# Patient Record
Sex: Female | Born: 1985
Health system: Southern US, Community
[De-identification: ages and names within clinical notes are randomized; demographics above are authoritative.]

## PROBLEM LIST (undated history)

## (undated) DIAGNOSIS — M549 Dorsalgia, unspecified: Secondary | ICD-10-CM

## (undated) DIAGNOSIS — R002 Palpitations: Secondary | ICD-10-CM

## (undated) DIAGNOSIS — K5900 Constipation, unspecified: Secondary | ICD-10-CM

## (undated) DIAGNOSIS — R12 Heartburn: Secondary | ICD-10-CM

## (undated) DIAGNOSIS — M2550 Pain in unspecified joint: Secondary | ICD-10-CM

## (undated) DIAGNOSIS — E559 Vitamin D deficiency, unspecified: Secondary | ICD-10-CM

## (undated) DIAGNOSIS — E039 Hypothyroidism, unspecified: Secondary | ICD-10-CM

## (undated) DIAGNOSIS — E538 Deficiency of other specified B group vitamins: Secondary | ICD-10-CM

## (undated) HISTORY — DX: Palpitations: R00.2

## (undated) HISTORY — DX: Vitamin D deficiency, unspecified: E55.9

## (undated) HISTORY — DX: Pain in unspecified joint: M25.50

## (undated) HISTORY — DX: Hypothyroidism, unspecified: E03.9

## (undated) HISTORY — DX: Constipation, unspecified: K59.00

## (undated) HISTORY — DX: Heartburn: R12

## (undated) HISTORY — DX: Deficiency of other specified B group vitamins: E53.8

## (undated) HISTORY — DX: Dorsalgia, unspecified: M54.9

---
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
## 2006-08-06 ENCOUNTER — Encounter: Admission: RE | Admit: 2006-08-06 | Discharge: 2006-08-06 | Payer: Self-pay | Admitting: Family Medicine

## 2007-07-02 ENCOUNTER — Encounter: Admission: RE | Admit: 2007-07-02 | Discharge: 2007-07-02 | Payer: Self-pay | Admitting: Family Medicine

## 2007-07-16 IMAGING — US US SOFT TISSUE HEAD/NECK
1 series · 14 of 25 positions shown · non-contrast
Comparison: None.

CLINICAL DATA: Enlarged thyroid.  Patient hyperthyroid on thyroid medications.
 THYROID ULTRASOUND:
TECHNIQUE: Ultrasound examination of the thyroid gland and adjacent soft tissue structures was performed.

[Series 1: unknown · 0.07mm/px · 14 of 47 slices shown]
[im 1/47]
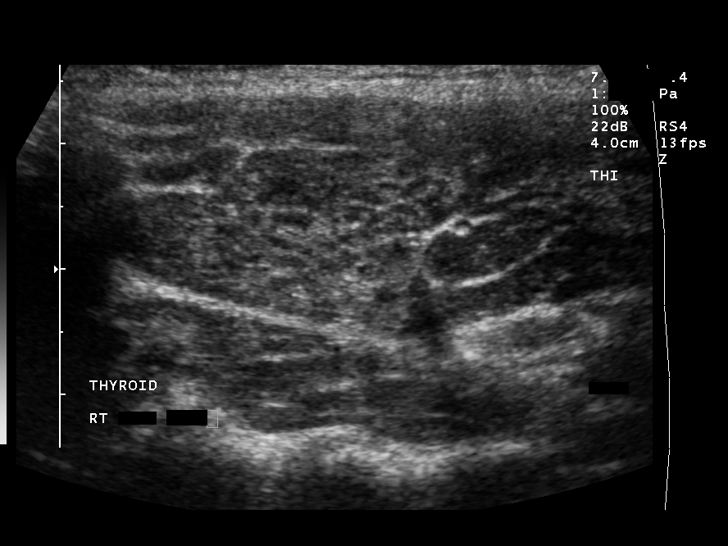
[im 4/47]
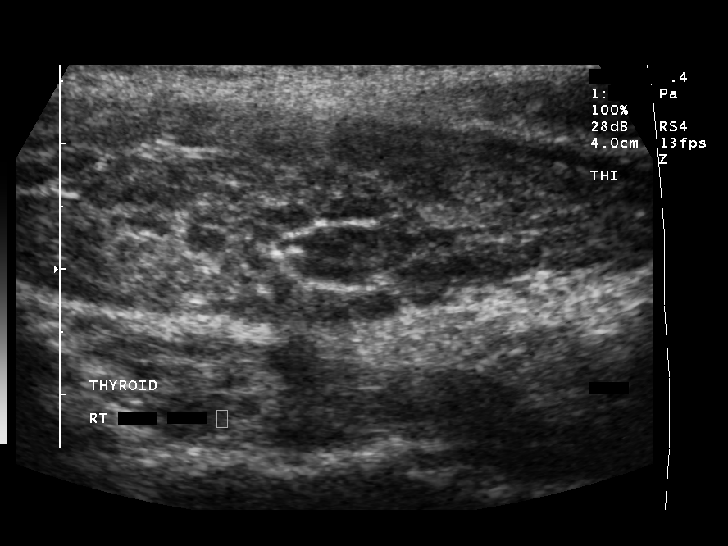
[im 8/47]
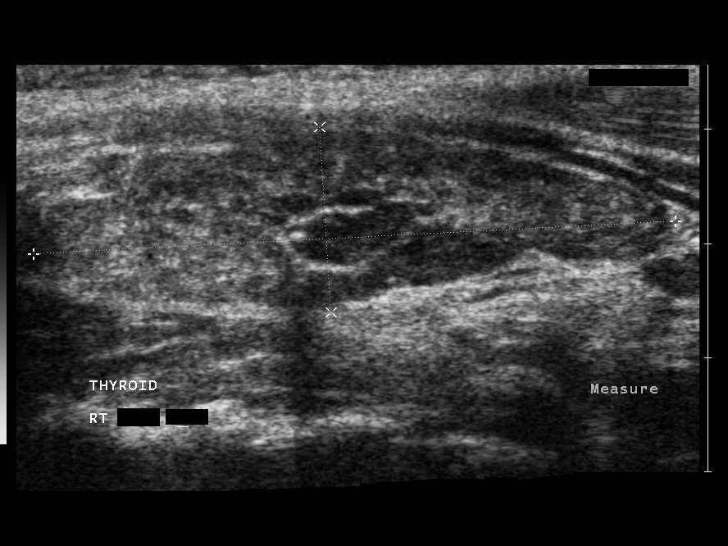
[im 12/47]
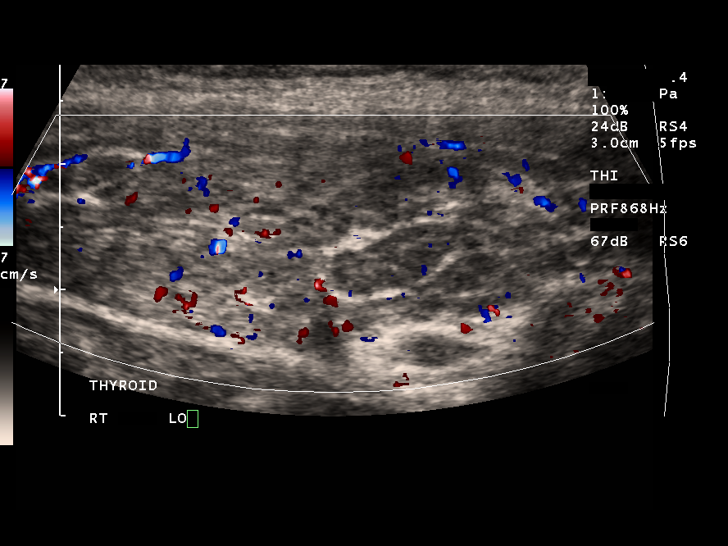
[im 16/47]
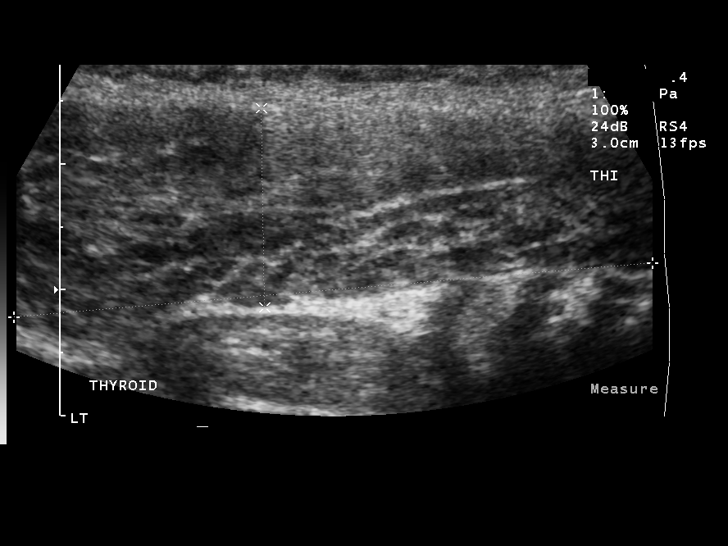
[im 18/47]
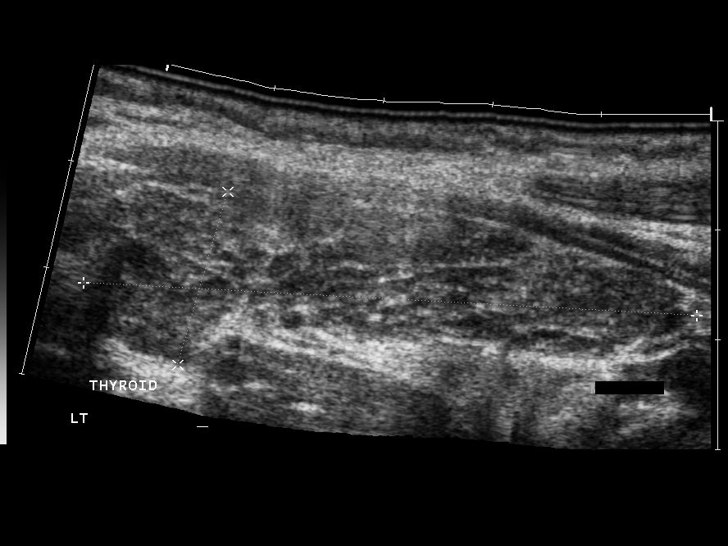
[im 22/47]
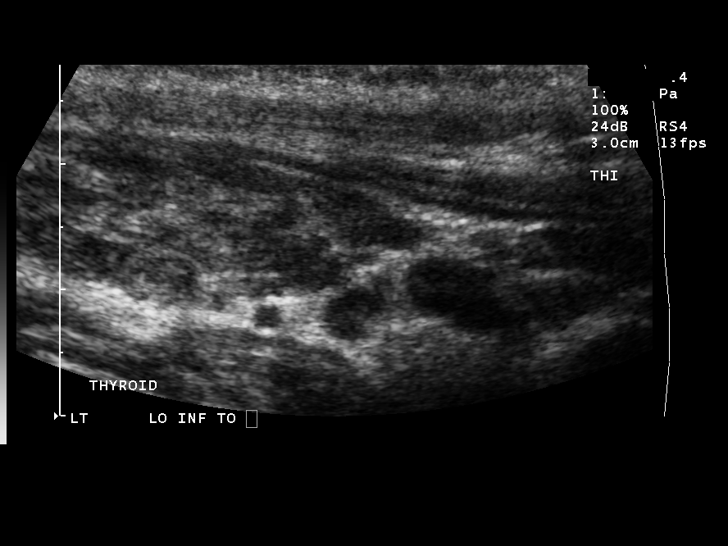
[im 25/47]
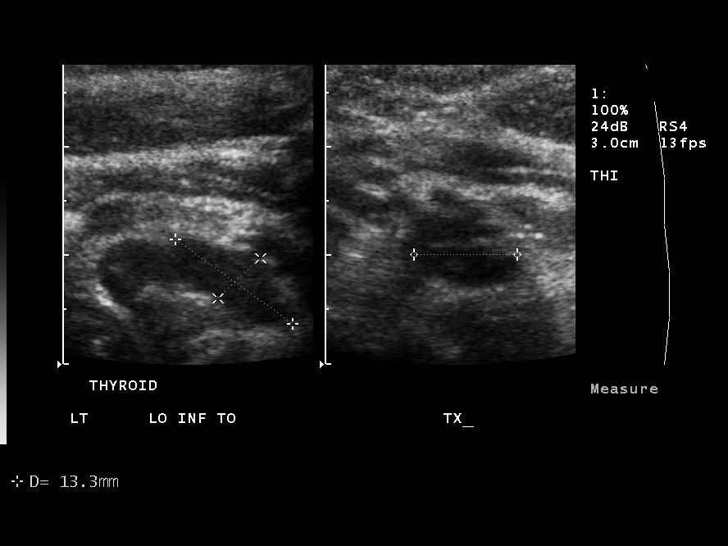
[im 29/47]
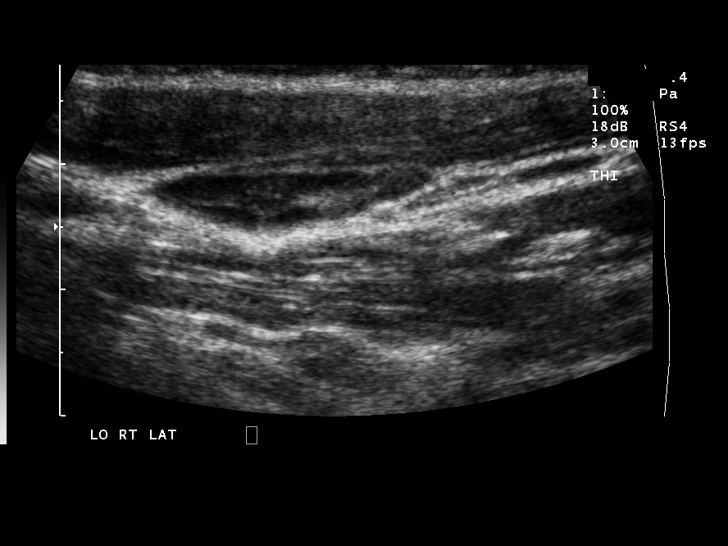
[im 31/47]
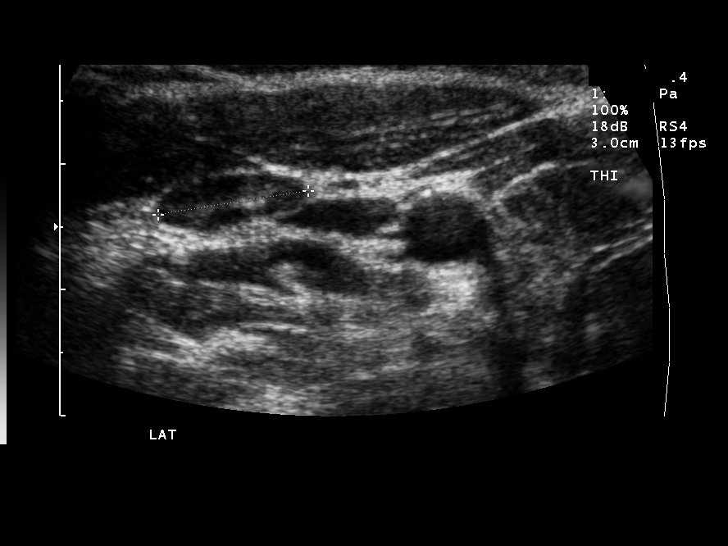
[im 35/47]
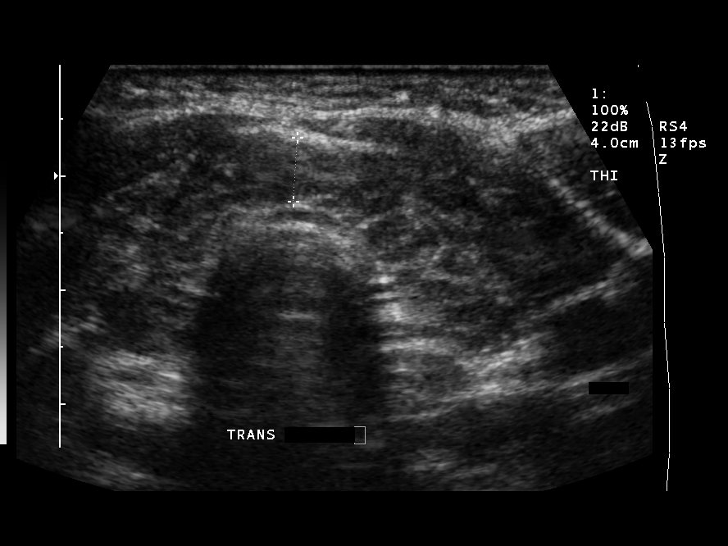
[im 39/47]
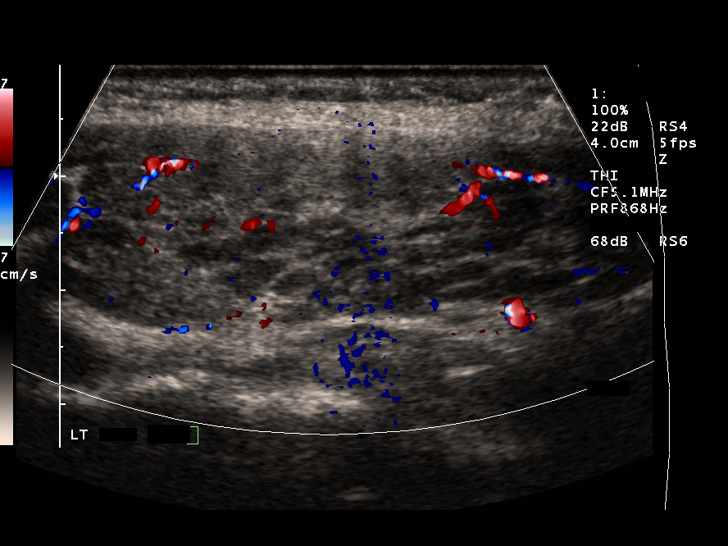
[im 43/47]
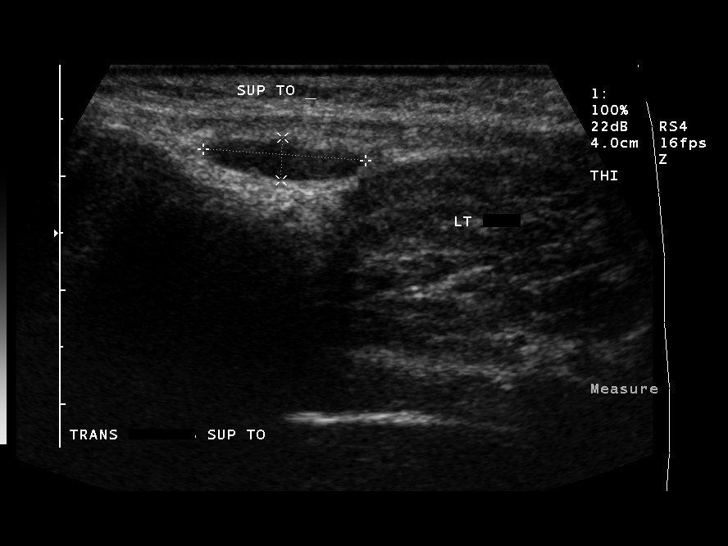
[im 47/47]
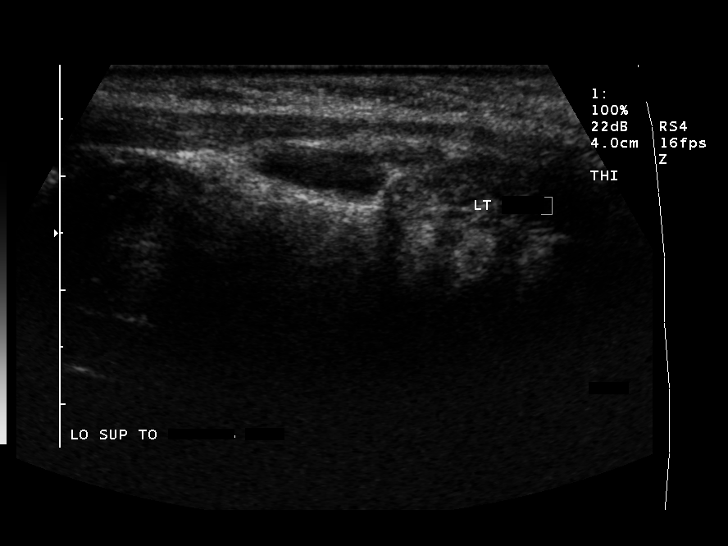

[14 of 25 positions shown; findings below may reference images not displayed]

FINDINGS: The right lobe measures 5.6 x 1.6 x 1.6 cm.  The left lobe measures 5.7 x 1.6 x 1.9 cm.  The isthmus is 6 mm.  Thyroid echotexture is markedly heterogeneous.  Vascularity is increased to the thyroid.  No focal measurable nodules are identified. 
 Incidental note is made of prominent lymph nodes surrounding the thyroid bilaterally.  The largest on the right measures 2.2 cm maximally.
IMPRESSION: 1.  Consistent with the history of hyperthyroidism, the thyroid is enlarged, heterogeneous, and hypervascular.
 2.  No focal measurable nodule is seen.
 3.  Prominent surrounding cervical lymph nodes are most likely reactive.

## 2008-06-10 IMAGING — US US SOFT TISSUE HEAD/NECK
1 series · 14 of 24 positions shown · non-contrast
Comparison: Prior ultrasound of 08/06/06.

CLINICAL DATA: Follow up thyroid goiter.  
 ULTRASOUND SOFT TISSUE HEAD/NECK:
TECHNIQUE: Ultrasound examination of the soft tissues was performed in the area of clinical concern.

[Series 1: unknown · 0.07mm/px · 14 of 24 slices shown]
[im 1/24]
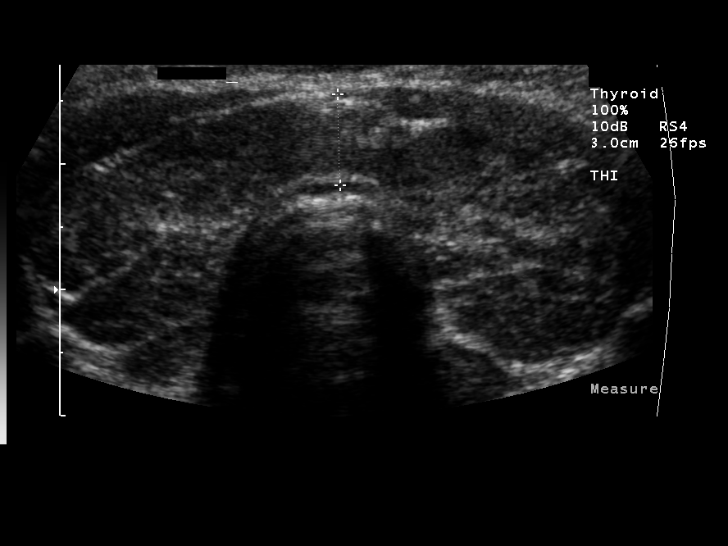
[im 3/24]
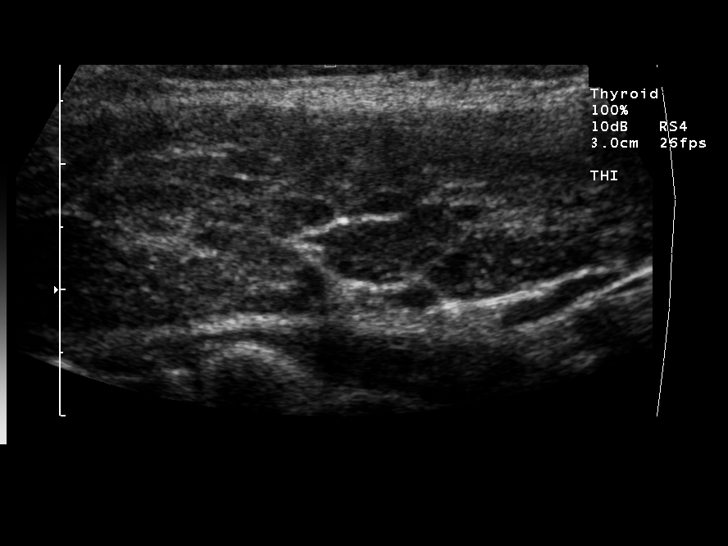
[im 5/24]
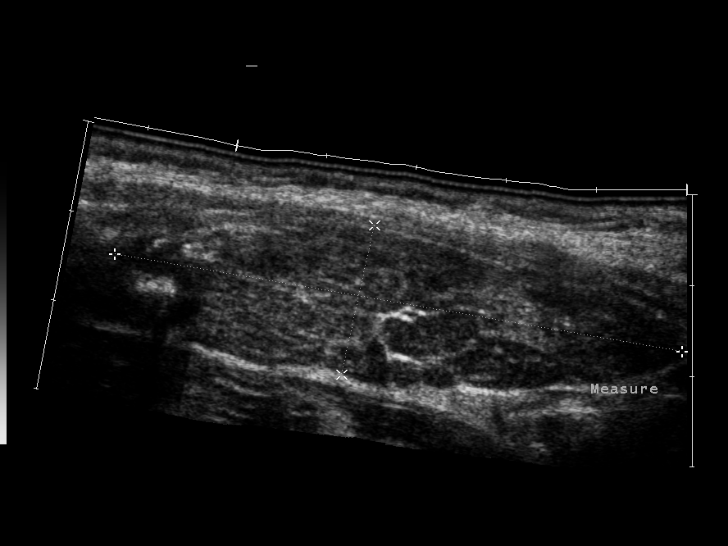
[im 7/24]
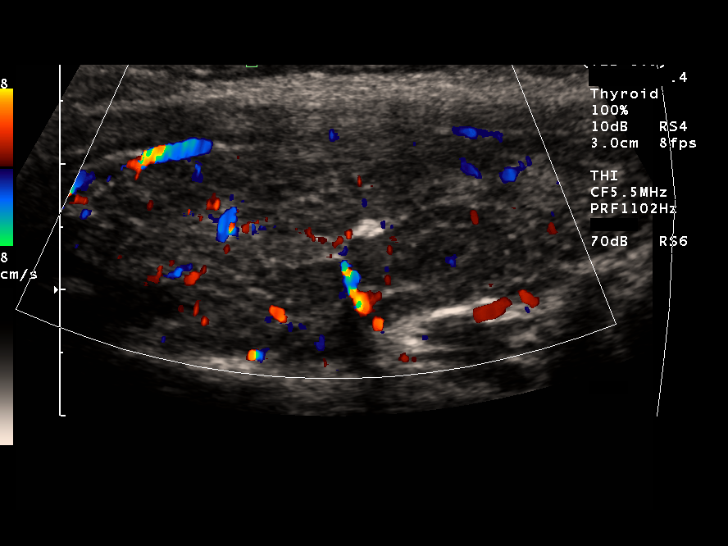
[im 8/24]
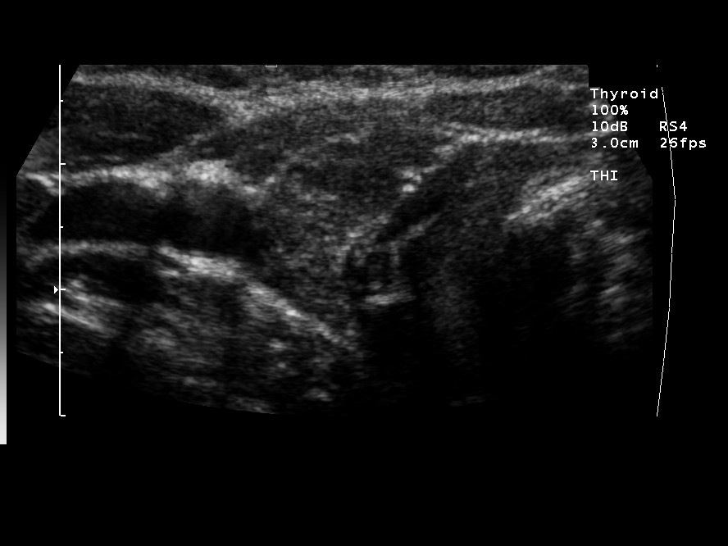
[im 10/24]
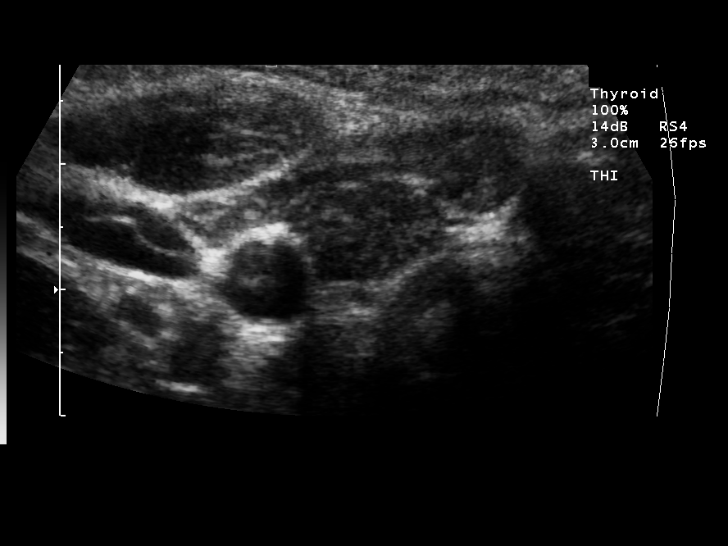
[im 12/24]
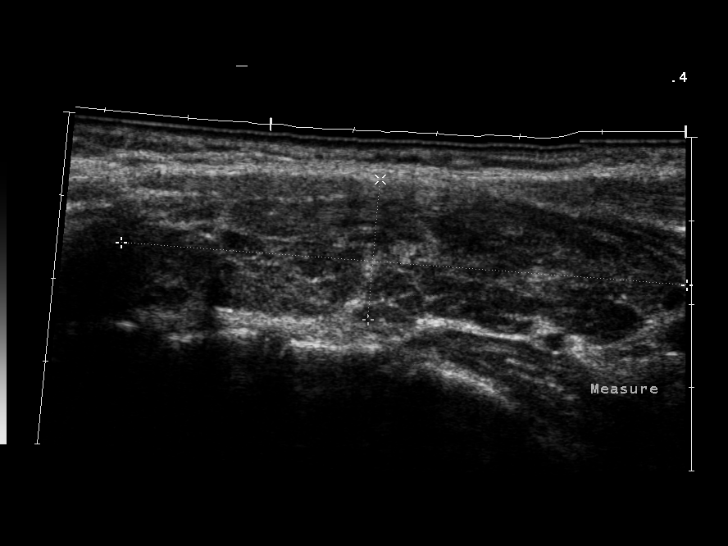
[im 13/24]
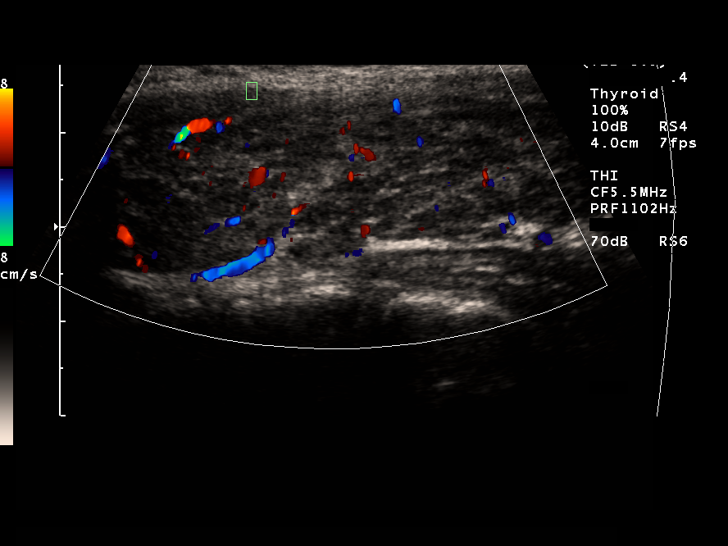
[im 15/24]
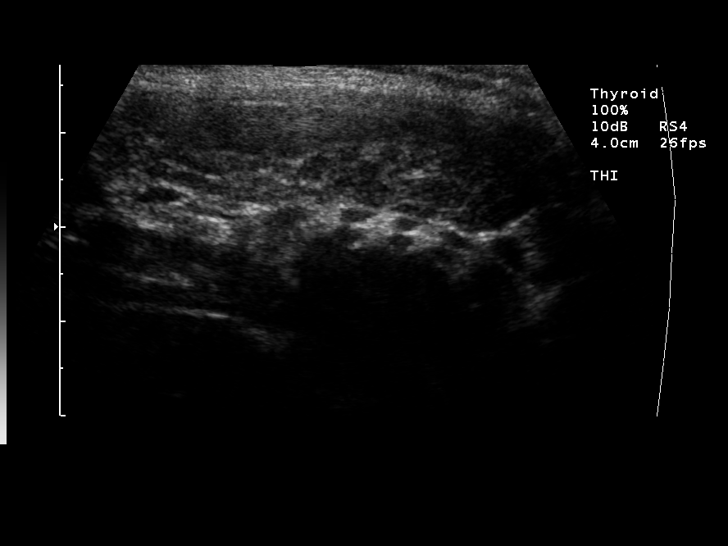
[im 17/24]
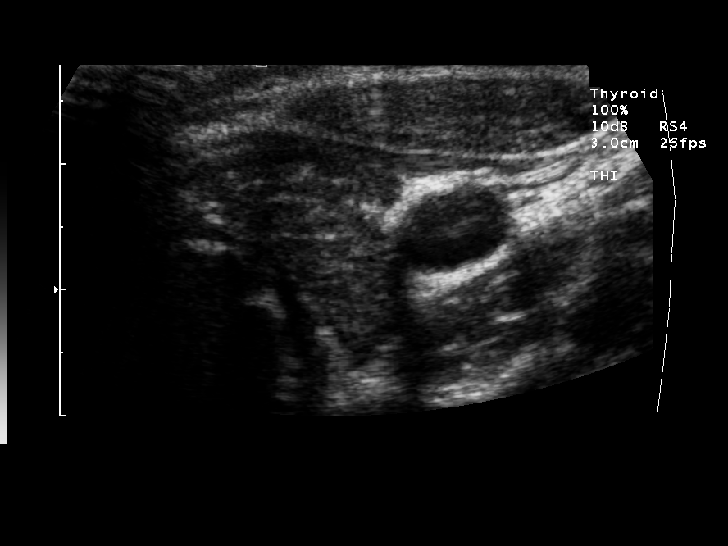
[im 19/24]
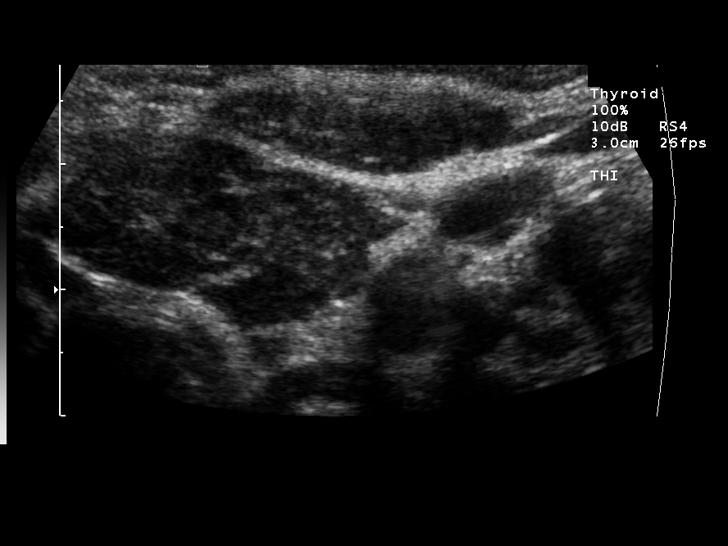
[im 20/24]
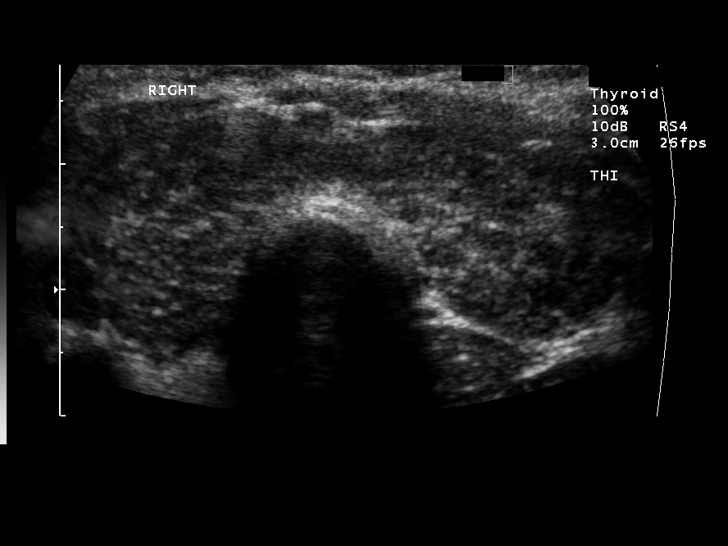
[im 22/24]
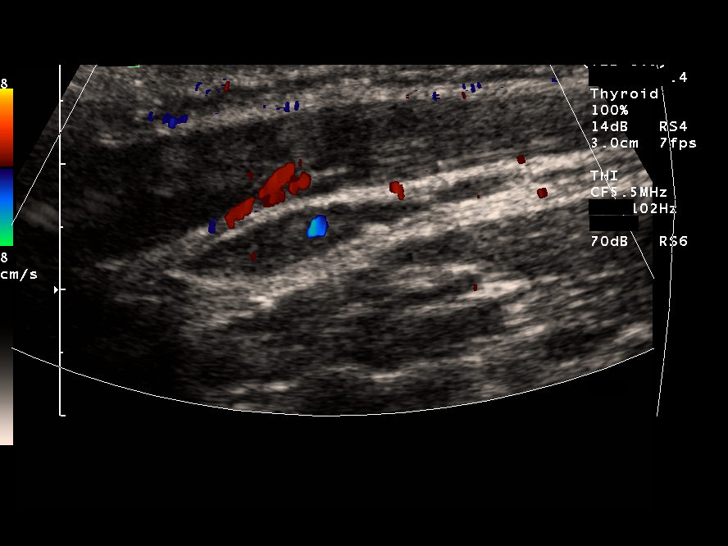
[im 24/24]
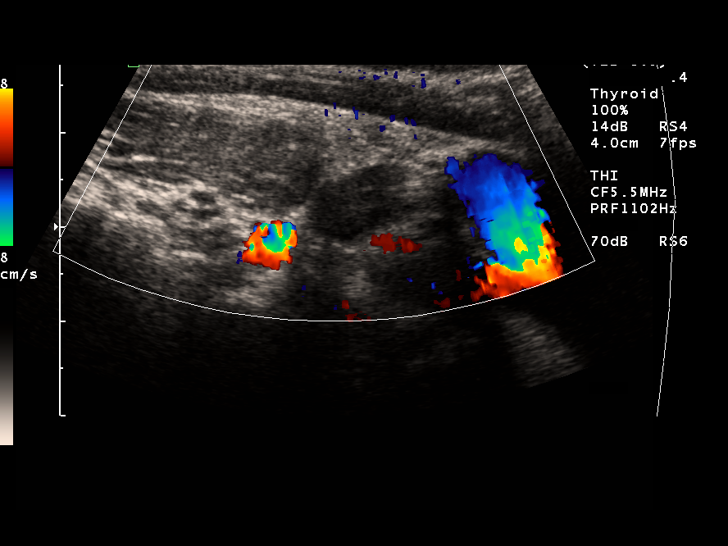

[14 of 24 positions shown; findings below may reference images not displayed]

FINDINGS: The thyroid gland has not changed significantly in size although it appears slightly longer than on the prior study.  The right lobe measures 6.4cm sagittally with a depth of 1.7cm and width of 1.5cm.  The left lobe measures 6.8 x 1.7 x 2.0cm with the isthmus measuring 7mm.  The gland is diffusely inhomogeneous.  No solid or cystic nodule is seen.  Adjacent lymph nodes are again noted not changed significantly.
IMPRESSION: No significant change in inhomogeneous thyroid gland with no discrete solid or cystic nodule.

## 2010-06-15 ENCOUNTER — Other Ambulatory Visit: Admission: RE | Admit: 2010-06-15 | Discharge: 2010-06-15 | Payer: Self-pay | Admitting: *Deleted

## 2015-09-27 MED FILL — LEVOTHYROXINE 125 MCG TAB: 125 | 90 days supply | Qty: 90 | Fill #3

## 2015-09-27 MED FILL — TRI-PREVIFEM TABLET: 0.18/0.215/ | 84 days supply | Qty: 84 | Fill #2

## 2015-12-25 DIAGNOSIS — E039 Hypothyroidism, unspecified: Secondary | ICD-10-CM | POA: Insufficient documentation

## 2015-12-25 DIAGNOSIS — Z30011 Encounter for initial prescription of contraceptive pills: Secondary | ICD-10-CM | POA: Insufficient documentation

## 2015-12-28 DIAGNOSIS — E034 Atrophy of thyroid (acquired): Secondary | ICD-10-CM | POA: Diagnosis not present

## 2015-12-28 DIAGNOSIS — Z0000 Encounter for general adult medical examination without abnormal findings: Secondary | ICD-10-CM | POA: Diagnosis not present

## 2016-01-03 MED FILL — LEVOTHYROXINE 125 MCG TAB: 125 | 90 days supply | Qty: 90 | Fill #0

## 2016-01-03 MED FILL — TRI-PREVIFEM TABLET: 0.18/0.215/ | 84 days supply | Qty: 84 | Fill #3

## 2016-04-11 MED FILL — LEVOTHYROXINE 125 MCG TAB: 125 | 90 days supply | Qty: 90 | Fill #1

## 2016-04-17 MED FILL — TRI-PREVIFEM TABLET: 0.18/0.215/ | 84 days supply | Qty: 84 | Fill #0

## 2016-08-27 MED FILL — TRI-PREVIFEM TABLET: 0.18/0.215/ | 84 days supply | Qty: 84 | Fill #1

## 2016-08-27 MED FILL — LEVOTHYROXINE 125 MCG TABLE: 125 | 90 days supply | Qty: 90 | Fill #2

## 2016-12-31 MED FILL — TRI-PREVIFEM TABLET: 0.18/0.215/ | 84 days supply | Qty: 84 | Fill #2

## 2016-12-31 MED FILL — LEVOTHYROXINE 125 MCG TABLE: 125 | 90 days supply | Qty: 90 | Fill #3

## 2017-01-01 DIAGNOSIS — R87610 Atypical squamous cells of undetermined significance on cytologic smear of cervix (ASC-US): Secondary | ICD-10-CM | POA: Diagnosis not present

## 2017-01-01 DIAGNOSIS — Z01419 Encounter for gynecological examination (general) (routine) without abnormal findings: Secondary | ICD-10-CM | POA: Diagnosis not present

## 2017-01-01 DIAGNOSIS — E039 Hypothyroidism, unspecified: Secondary | ICD-10-CM | POA: Diagnosis not present

## 2017-01-01 DIAGNOSIS — Z0000 Encounter for general adult medical examination without abnormal findings: Secondary | ICD-10-CM | POA: Diagnosis not present

## 2017-01-03 MED FILL — LEVOTHYROXINE 137 MCG TAB: 137 | 30 days supply | Qty: 30 | Fill #0

## 2017-02-04 MED FILL — LEVOTHYROXINE 137 MCG TAB: 137 | 30 days supply | Qty: 30 | Fill #1

## 2017-02-05 MED FILL — VIT D3-50 50,000 UNITS CAPS: 1.25 MG | 84 days supply | Qty: 12 | Fill #0

## 2017-02-11 DIAGNOSIS — E039 Hypothyroidism, unspecified: Secondary | ICD-10-CM | POA: Diagnosis not present

## 2017-05-15 MED FILL — VIT D3-50 50,000 UNITS CAPS: 1.25 MG | 84 days supply | Qty: 12 | Fill #1

## 2017-05-16 MED FILL — LEVOTHYROXINE 125 MCG TABLE: 125 | 30 days supply | Qty: 30 | Fill #0

## 2017-05-16 MED FILL — TRI-PREVIFEM TABLET: 0.18/0.215/ | 84 days supply | Qty: 84 | Fill #0

## 2017-06-23 MED FILL — LEVOTHYROXINE 125 MCG TABLE: 125 | 30 days supply | Qty: 30 | Fill #1

## 2017-08-05 MED FILL — LEVOTHYROXINE 125 MCG TABLE: 125 | 30 days supply | Qty: 30 | Fill #0

## 2017-09-19 MED FILL — LEVOTHYROXINE 125 MCG TAB: 125 | 30 days supply | Qty: 30 | Fill #1

## 2017-10-21 MED FILL — LEVOTHYROXINE 125 MCG TAB: 125 | 30 days supply | Qty: 30 | Fill #0

## 2017-10-21 MED FILL — VIT D3-50 50,000 UNITS CAPS: 1.25 MG | 84 days supply | Qty: 12 | Fill #0

## 2017-10-21 MED FILL — NORG-EE 0.18-0.215-0.25/0.0: 0.18/0.215/ | 28 days supply | Qty: 28 | Fill #0

## 2017-12-17 DIAGNOSIS — E039 Hypothyroidism, unspecified: Secondary | ICD-10-CM | POA: Diagnosis not present

## 2017-12-17 MED FILL — LEVOTHYROXINE 125 MCG TAB: 125 | 15 days supply | Qty: 15 | Fill #0

## 2018-01-02 DIAGNOSIS — Z0000 Encounter for general adult medical examination without abnormal findings: Secondary | ICD-10-CM | POA: Diagnosis not present

## 2018-01-07 DIAGNOSIS — Z800 Family history of malignant neoplasm of digestive organs: Secondary | ICD-10-CM | POA: Insufficient documentation

## 2018-01-07 DIAGNOSIS — Z0000 Encounter for general adult medical examination without abnormal findings: Secondary | ICD-10-CM | POA: Diagnosis not present

## 2018-01-27 MED FILL — LEVOTHYROXINE 125 MCG TAB: 125 | 90 days supply | Qty: 90 | Fill #0

## 2018-05-20 MED FILL — LEVOTHYROXINE 125 MCG TAB: 125 | 90 days supply | Qty: 90 | Fill #1

## 2018-05-20 MED FILL — TRI FEMYNOR 28 TABLET: 0.18/0.215/ | 84 days supply | Qty: 84 | Fill #0

## 2018-08-25 MED FILL — LEVOTHYROXINE 125 MCG TAB: 125 | 90 days supply | Qty: 90 | Fill #2

## 2018-11-12 ENCOUNTER — Ambulatory Visit (INDEPENDENT_AMBULATORY_CARE_PROVIDER_SITE_OTHER): Payer: Self-pay | Admitting: Nurse Practitioner

## 2018-11-12 VITALS — BP 120/80 | HR 82 | Temp 98.6°F | Resp 16 | Wt 235.0 lb

## 2018-11-12 DIAGNOSIS — R05 Cough: Secondary | ICD-10-CM

## 2018-11-12 DIAGNOSIS — J101 Influenza due to other identified influenza virus with other respiratory manifestations: Secondary | ICD-10-CM

## 2018-11-12 DIAGNOSIS — R059 Cough, unspecified: Secondary | ICD-10-CM

## 2018-11-12 LAB — POCT INFLUENZA A/B
INFLUENZA A, POC: POSITIVE — AB
INFLUENZA B, POC: NEGATIVE

## 2018-11-12 MED ORDER — BALOXAVIR MARBOXIL(80 MG DOSE) 2 X 40 MG PO TBPK: 80.0000 mg | ORAL_TABLET | Freq: Once | ORAL | 0 refills | Status: AC

## 2018-11-12 MED ORDER — PROMETHAZINE-DM 6.25-15 MG/5ML PO SYRP: 5.0000 mL | ORAL_SOLUTION | Freq: Four times a day (QID) | ORAL | 0 refills | Status: DC | PRN

## 2018-11-12 MED ORDER — PROMETHAZINE-DM 6.25-15 MG/5ML PO SYRP: 5.0000 mL | ORAL_SOLUTION | Freq: Four times a day (QID) | ORAL | 0 refills | Status: AC | PRN

## 2018-11-12 MED ORDER — FLUTICASONE PROPIONATE 50 MCG/ACT NA SUSP: 2.0000 | Freq: Every day | NASAL | 0 refills | Status: DC

## 2018-11-12 NOTE — Progress Notes (Signed)
Subjective:     Rebecca Mcneil is a 33 y.o. female who presents for evaluation of influenza like symptoms. Symptoms include chills, dry cough, left ear pressure, headache, nasal blockage and sore throat and have been present for 2 days. Patient denies known fever, productive cough, wheezing, SOB, difficulty breathing,  She has tried to alleviate the symptoms with acetaminophen with minimal relief. High risk factors for influenza complications: none.  Patient denies chance of pregnancy. Patient has taken Tylenol for her symptoms with minimal relief.  The following portions of the patient's history were reviewed and updated as appropriate: allergies, current medications and past medical history.  Review of Systems Constitutional: positive for chills and fatigue, negative for anorexia, malaise and sweats Eyes: negative Ears, nose, mouth, throat, and face: positive for sore throat and left ear pressure, left maxillary sinus pressure, negative for ear drainage, earaches and hoarseness Respiratory: positive for cough, negative for asthma, dyspnea on exertion, pleurisy/chest pain, sputum, stridor and wheezing Cardiovascular: negative Gastrointestinal: negative Neurological: positive for headaches, negative for coordination problems, dizziness, gait problems, paresthesia, vertigo and weakness     Objective:    BP 120/80 (BP Location: Right Arm, Patient Position: Sitting, Cuff Size: Normal)   Pulse 82   Temp 98.6 F (37 C)   Resp 16   Wt 235 lb (106.6 kg)   SpO2 98%    Physical Exam Vitals signs reviewed.  Constitutional:      General: She is in acute distress.  HENT:     Head: Normocephalic.     Right Ear: Tympanic membrane, ear canal and external ear normal.     Left Ear: Tympanic membrane, ear canal and external ear normal.     Nose: Mucosal edema and congestion (moderate) present. No rhinorrhea.     Right Turbinates: Enlarged and swollen.     Left Turbinates: Enlarged and swollen.   Right Sinus: No maxillary sinus tenderness or frontal sinus tenderness.     Left Sinus: Maxillary sinus tenderness present. No frontal sinus tenderness.     Mouth/Throat:     Lips: Pink.     Mouth: Mucous membranes are moist.     Pharynx: Uvula midline. Pharyngeal swelling and posterior oropharyngeal erythema present.     Tonsils: No tonsillar exudate or tonsillar abscesses. Swelling: 1+ on the right. 1+ on the left.  Eyes:     Pupils: Pupils are equal, round, and reactive to light.  Neck:     Musculoskeletal: Normal range of motion and neck supple.  Cardiovascular:     Rate and Rhythm: Normal rate and regular rhythm.     Pulses: Normal pulses.     Heart sounds: Normal heart sounds.  Pulmonary:     Effort: Pulmonary effort is normal. No respiratory distress.     Breath sounds: Normal breath sounds. No wheezing, rhonchi or rales.  Abdominal:     General: Abdomen is flat.     Palpations: Abdomen is soft.     Tenderness: There is no abdominal tenderness.  Lymphadenopathy:     Cervical: No cervical adenopathy.  Skin:    General: Skin is warm and dry.     Capillary Refill: Capillary refill takes less than 2 seconds.  Neurological:     General: No focal deficit present.     Mental Status: She is alert and oriented to person, place, and time.     Cranial Nerves: No cranial nerve deficit.  Psychiatric:        Mood and Affect: Mood  normal.        Thought Content: Thought content normal.    Results for orders placed or performed in visit on 11/12/18 (from the past 24 hour(s))  POCT Influenza A/B     Status: Abnormal   Collection Time: 11/12/18  6:20 PM  Result Value Ref Range   Influenza A, POC Positive (A) Negative   Influenza B, POC Negative Negative     Assessment:    Influenza A    Plan:   Exam findings, diagnosis etiology and medication use and indications reviewed with patient. Follow- Up and discharge instructions provided. No emergent/urgent issues found on exam.  I  decided to perform an influenza test on the patient as her symptoms were not classic of influenza as patient's main complaint was her cough.  Patient's influenza results were positive for influenza A.  Patient opted to begin [place], and I will also advised symptomatic treatment for the patient to include fluticasone and Promethazine DM.  Patient was also instructed to perform symptomatic treatment to include antipyretics, increasing fluids, and getting plenty of rest.  The patient is not in any acute distress, is well-appearing, and vital signs are stable at discharge.  Patient education was provided. Patient verbalized understanding of information provided and agrees with plan of care (POC), all questions answered. The patient is advised to call or return to clinic if condition does not see an improvement in symptoms, or to seek the care of the closest emergency department if condition worsens with the above plan.   1. Cough  - POCT Influenza A/B  2. Influenza A  - Baloxavir Marboxil,80 MG Dose, (XOFLUZA) 2 x 40 MG TBPK; Take 80 mg by mouth once for 1 dose.  Dispense: 1 each; Refill: 0 - fluticasone (FLONASE) 50 MCG/ACT nasal spray; Place 2 sprays into both nostrils daily for 10 days.  Dispense: 16 g; Refill: 0 - promethazine-dextromethorphan (PROMETHAZINE-DM) 6.25-15 MG/5ML syrup; Take 5 mLs by mouth 4 (four) times daily as needed for up to 7 days.  Dispense: 140 mL; Refill: 0 -Take medication as prescribed. -Ibuprofen or Tylenol for pain, fever, or general discomfort. -Increase fluids. -Get plenty of rest. -May also use normal saline nasal spray as needed.  -Warm saltwater gargles 3 to 4 times daily for throat pain until symptoms improve. -Sleep elevated on at least 2 pillows at bedtime to help with cough. -Use a humidifier or vaporizer when at home and during sleep to help with cough. -May use a teaspoon of honey or over-the-counter cough drops to help with cough and sore throat. -Follow-up  if symptoms do not improve. Marland Kitchen

## 2018-11-12 NOTE — Patient Instructions (Signed)
Influenza, Adult -Take medication as prescribed. -Ibuprofen or Tylenol for pain, fever, or general discomfort. -Increase fluids. -Get plenty of rest. -May also use normal saline nasal spray as needed.  -Warm saltwater gargles 3 to 4 times daily for throat pain until symptoms improve. -Sleep elevated on at least 2 pillows at bedtime to help with cough. -Use a humidifier or vaporizer when at home and during sleep to help with cough. -May use a teaspoon of honey or over-the-counter cough drops to help with cough and sore throat. -Follow-up if symptoms do not improve.  Influenza, more commonly known as "the flu," is a viral infection that mainly affects the respiratory tract. The respiratory tract includes organs that help you breathe, such as the lungs, nose, and throat. The flu causes many symptoms similar to the common cold along with high fever and body aches. The flu spreads easily from person to person (is contagious). Getting a flu shot (influenza vaccination) every year is the best way to prevent the flu. What are the causes? This condition is caused by the influenza virus. You can get the virus by:  Breathing in droplets that are in the air from an infected person's cough or sneeze.  Touching something that has been exposed to the virus (has been contaminated) and then touching your mouth, nose, or eyes. What increases the risk? The following factors may make you more likely to get the flu:  Not washing or sanitizing your hands often.  Having close contact with many people during cold and flu season.  Touching your mouth, eyes, or nose without first washing or sanitizing your hands.  Not getting a yearly (annual) flu shot. You may have a higher risk for the flu, including serious problems such as a lung infection (pneumonia), if you:  Are older than 65.  Are pregnant.  Have a weakened disease-fighting system (immune system). You may have a weakened immune system if  you: ? Have HIV or AIDS. ? Are undergoing chemotherapy. ? Are taking medicines that reduce (suppress) the activity of your immune system.  Have a long-term (chronic) illness, such as heart disease, kidney disease, diabetes, or lung disease.  Have a liver disorder.  Are severely overweight (morbidly obese).  Have anemia. This is a condition that affects your red blood cells.  Have asthma. What are the signs or symptoms? Symptoms of this condition usually begin suddenly and last 4-14 days. They may include:  Fever and chills.  Headaches, body aches, or muscle aches.  Sore throat.  Cough.  Runny or stuffy (congested) nose.  Chest discomfort.  Poor appetite.  Weakness or fatigue.  Dizziness.  Nausea or vomiting. How is this diagnosed? This condition may be diagnosed based on:  Your symptoms and medical history.  A physical exam.  Swabbing your nose or throat and testing the fluid for the influenza virus. How is this treated? If the flu is diagnosed early, you can be treated with medicine that can help reduce how severe the illness is and how long it lasts (antiviral medicine). This may be given by mouth (orally) or through an IV. Taking care of yourself at home can help relieve symptoms. Your health care provider may recommend:  Taking over-the-counter medicines.  Drinking plenty of fluids. In many cases, the flu goes away on its own. If you have severe symptoms or complications, you may be treated in a hospital. Follow these instructions at home: Activity  Rest as needed and get plenty of sleep.  Stay home  from work or school as told by your health care provider. Unless you are visiting your health care provider, avoid leaving home until your fever has been gone for 24 hours without taking medicine. Eating and drinking  Take an oral rehydration solution (ORS). This is a drink that is sold at pharmacies and retail stores.  Drink enough fluid to keep your  urine pale yellow.  Drink clear fluids in small amounts as you are able. Clear fluids include water, ice chips, diluted fruit juice, and low-calorie sports drinks.  Eat bland, easy-to-digest foods in small amounts as you are able. These foods include bananas, applesauce, rice, lean meats, toast, and crackers.  Avoid drinking fluids that contain a lot of sugar or caffeine, such as energy drinks, regular sports drinks, and soda.  Avoid alcohol.  Avoid spicy or fatty foods. General instructions      Take over-the-counter and prescription medicines only as told by your health care provider.  Use a cool mist humidifier to add humidity to the air in your home. This can make it easier to breathe.  Cover your mouth and nose when you cough or sneeze.  Wash your hands with soap and water often, especially after you cough or sneeze. If soap and water are not available, use alcohol-based hand sanitizer.  Keep all follow-up visits as told by your health care provider. This is important. How is this prevented?   Get an annual flu shot. You may get the flu shot in late summer, fall, or winter. Ask your health care provider when you should get your flu shot.  Avoid contact with people who are sick during cold and flu season. This is generally fall and winter. Contact a health care provider if:  You develop new symptoms.  You have: ? Chest pain. ? Diarrhea. ? A fever.  Your cough gets worse.  You produce more mucus.  You feel nauseous or you vomit. Get help right away if:  You develop shortness of breath or difficulty breathing.  Your skin or nails turn a bluish color.  You have severe pain or stiffness in your neck.  You develop a sudden headache or sudden pain in your face or ear.  You cannot eat or drink without vomiting. Summary  Influenza, more commonly known as "the flu," is a viral infection that primarily affects your respiratory tract.  Symptoms of the flu usually  begin suddenly and last 4-14 days.  Getting an annual flu shot is the best way to prevent getting the flu.  Stay home from work or school as told by your health care provider. Unless you are visiting your health care provider, avoid leaving home until your fever has been gone for 24 hours without taking medicine.  Keep all follow-up visits as told by your health care provider. This is important. This information is not intended to replace advice given to you by your health care provider. Make sure you discuss any questions you have with your health care provider. Document Released: 08/30/2000 Document Revised: 02/18/2018 Document Reviewed: 02/18/2018 [employer] Interactive Patient Education  2019 [employer].

## 2018-11-13 MED FILL — PROMETHAZINE W/DM SYRUP: 6.25-15 | 7 days supply | Qty: 140 | Fill #0

## 2018-11-16 ENCOUNTER — Telehealth: Payer: Self-pay

## 2018-11-16 NOTE — Telephone Encounter (Signed)
Patient did not answered the phone

## 2018-12-03 MED FILL — LEVOTHYROXINE 125 MCG TAB: 125 | 90 days supply | Qty: 90 | Fill #3

## 2019-02-11 DIAGNOSIS — Z0000 Encounter for general adult medical examination without abnormal findings: Secondary | ICD-10-CM | POA: Diagnosis not present

## 2019-03-03 DIAGNOSIS — Z124 Encounter for screening for malignant neoplasm of cervix: Secondary | ICD-10-CM | POA: Diagnosis not present

## 2019-03-03 DIAGNOSIS — E559 Vitamin D deficiency, unspecified: Secondary | ICD-10-CM | POA: Diagnosis not present

## 2019-03-03 DIAGNOSIS — Z800 Family history of malignant neoplasm of digestive organs: Secondary | ICD-10-CM | POA: Diagnosis not present

## 2019-03-03 DIAGNOSIS — Z3041 Encounter for surveillance of contraceptive pills: Secondary | ICD-10-CM | POA: Diagnosis not present

## 2019-03-03 DIAGNOSIS — Z0000 Encounter for general adult medical examination without abnormal findings: Secondary | ICD-10-CM | POA: Diagnosis not present

## 2019-03-03 MED FILL — TRI FEMYNOR 28 TABLET: 0.18/0.215/ | 84 days supply | Qty: 84 | Fill #0

## 2019-03-08 MED FILL — LEVOTHYROXINE 125 MCG TAB: 125 | 90 days supply | Qty: 90 | Fill #0

## 2019-04-15 DIAGNOSIS — Z800 Family history of malignant neoplasm of digestive organs: Secondary | ICD-10-CM | POA: Diagnosis not present

## 2019-04-15 DIAGNOSIS — Z1211 Encounter for screening for malignant neoplasm of colon: Secondary | ICD-10-CM | POA: Diagnosis not present

## 2019-07-13 MED FILL — LEVOTHYROXINE 125 MCG TAB: 125 | 90 days supply | Qty: 90 | Fill #1

## 2019-07-28 ENCOUNTER — Ambulatory Visit (INDEPENDENT_AMBULATORY_CARE_PROVIDER_SITE_OTHER): Payer: 59 | Admitting: Family Medicine

## 2019-07-28 ENCOUNTER — Encounter (INDEPENDENT_AMBULATORY_CARE_PROVIDER_SITE_OTHER): Payer: Self-pay | Admitting: Family Medicine

## 2019-07-28 ENCOUNTER — Encounter: Payer: Self-pay | Admitting: Family Medicine

## 2019-07-28 ENCOUNTER — Other Ambulatory Visit: Payer: Self-pay

## 2019-07-28 VITALS — BP 131/82 | HR 81 | Temp 98.1°F | Ht 66.0 in | Wt 244.0 lb

## 2019-07-28 DIAGNOSIS — R5383 Other fatigue: Secondary | ICD-10-CM | POA: Diagnosis not present

## 2019-07-28 DIAGNOSIS — E039 Hypothyroidism, unspecified: Secondary | ICD-10-CM | POA: Diagnosis not present

## 2019-07-28 DIAGNOSIS — R0600 Dyspnea, unspecified: Secondary | ICD-10-CM | POA: Diagnosis not present

## 2019-07-28 DIAGNOSIS — Z0289 Encounter for other administrative examinations: Secondary | ICD-10-CM

## 2019-07-28 DIAGNOSIS — F3289 Other specified depressive episodes: Secondary | ICD-10-CM

## 2019-07-28 DIAGNOSIS — E559 Vitamin D deficiency, unspecified: Secondary | ICD-10-CM

## 2019-07-28 DIAGNOSIS — N926 Irregular menstruation, unspecified: Secondary | ICD-10-CM | POA: Diagnosis not present

## 2019-07-28 DIAGNOSIS — R0609 Other forms of dyspnea: Secondary | ICD-10-CM

## 2019-07-28 DIAGNOSIS — Z6839 Body mass index (BMI) 39.0-39.9, adult: Secondary | ICD-10-CM

## 2019-07-28 DIAGNOSIS — Z9189 Other specified personal risk factors, not elsewhere classified: Secondary | ICD-10-CM

## 2019-07-28 NOTE — Progress Notes (Signed)
.  Office: [phone]  /  Fax: [phone]   HPI:   Chief Complaint: OBESITY  Rebecca Mcneil (MR# [id number]) is a 33 y.o. female who presents on 07/28/2019 for obesity evaluation and treatment. Current BMI is Body mass index is 39.38 kg/m.Marland Kitchen Rebecca Mcneil has struggled with obesity for years and has been unsuccessful in either losing weight or maintaining long term weight loss. Rebecca Mcneil attended our information session and states she is currently in the action stage of change and ready to dedicate time achieving and maintaining a healthier weight.   Rebecca Mcneil states her family eats meals together she thinks her family will eat healthier with her she struggles with family and or coworkers weight loss sabotage her desired weight loss is 74 lbs she has been heavy most of her life she started gaining weight 3 years ago her heaviest weight ever was 244 lbs. she is somewhat of a picky eater and doesn't like to eat healthier foods  she has significant food cravings issues  she snacks frequently in the evenings she skips lunch 2-3 times per week she frequently makes poor food choices she frequently eats larger portions than normal  she has binge eating behaviors she struggles with emotional eating    Fatigue Rebecca Mcneil feels her energy is lower than it should be. This has worsened with weight gain and has worsened recently. Rebecca Mcneil denies daytime somnolence and admits to waking up still tired. Patient generally gets 6-7 hours of sleep per night, and states they generally do not sleep well most nights. Snoring is not present. Apneic episodes are not present. Epworth Sleepiness Score is 3. 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 She reports skipping breakfast and lunch most days.  Dyspnea on exertion Rebecca Mcneil notes increasing shortness of breath with exercising and seems to be worsening over time with weight gain. She notes getting out of breath sooner with activity than she used to.  This has gotten worse recently. Rebecca Mcneil denies orthopnea.  Vitamin D deficiency Rebecca Mcneil has a diagnosis of Vitamin D deficiency. She is currently taking a Vit D supplement and denies nausea, vomiting or muscle weakness.  At risk for osteopenia and osteoporosis Rebecca Mcneil is at higher risk of osteopenia and osteoporosis due to Vitamin D deficiency.   Irregular Menses Rebecca Mcneil has a diagnosis of irregular menses and is taking oral contraceptives to regulate her periods.  Depression with emotional eating behaviors Rebecca Mcneil is struggling with emotional eating and using food for comfort to the extent that it is negatively impacting her health. She often snacks when she is not hungry. Rebecca Mcneil sometimes feels she is out of control and then feels guilty that she made poor food choices. She has been working on behavior modification techniques to help reduce her emotional eating and has been somewhat successful. She shows no sign of suicidal or homicidal ideations.  Depression Screen Rebecca Mcneil's Food and Mood (modified PHQ-9) score was 11. Depression screen PHQ 2/9 07/28/2019  Decreased Interest 2  Down, Depressed, Hopeless 1  PHQ - 2 Score 3  Altered sleeping 1  Tired, decreased energy 3  Change in appetite 2  Feeling bad or failure about yourself  1  Trouble concentrating 1  Moving slowly or fidgety/restless 0  Suicidal thoughts 0  PHQ-9 Score 11  Difficult doing work/chores Somewhat difficult   Hypothyroidism Rebecca Mcneil has a diagnosis of hypothyroidism. She denies hot or cold intolerance or palpitations, but does admit to ongoing fatigue.  ASSESSMENT AND PLAN:  Other fatigue - Plan:  EKG 12-Lead, B12, Insulin, random, HgB A1c, Comprehensive Metabolic Panel (CMET)  Dyspnea on exertion - Plan: Lipid Panel With LDL/HDL Ratio  Acquired hypothyroidism - Plan: T3, T4, free, TSH  Vitamin D deficiency - Plan: Vitamin D (25 hydroxy)  Other depression  Irregular menses - Plan: CBC w/Diff/Platelet,  Comprehensive Metabolic Panel (CMET)  At risk for diabetes mellitus  Class 2 severe obesity with serious comorbidity and body mass index (BMI) of 39.0 to 39.9 in adult, unspecified obesity type (HCC)  PLAN:  Fatigue Rebecca Mcneil was informed that her fatigue may be related to obesity, depression or many other causes. Labs will be ordered, and in the meanwhile Rebecca Mcneil has agreed to work on diet, exercise and weight loss to help with fatigue. Proper sleep hygiene was discussed including the need for 7-8 hours of quality sleep each night. A sleep study was not ordered based on symptoms and Epworth score.  Dyspnea on exertion Rebecca Mcneil's shortness of breath appears to be obesity related and exercise induced. She had an increased BMR per IC, which is great. She has agreed to work on weight loss and gradually increase exercise to treat her exercise induced shortness of breath. If Rebecca Mcneil follows our instructions and loses weight without improvement of her shortness of breath, we will plan to refer to pulmonology. We will monitor this condition regularly. Rebecca Mcneil agrees to this plan.   Vitamin D Deficiency Rebecca Mcneil was informed that low Vitamin D levels contributes to fatigue and are associated with obesity, breast, and colon cancer. She will have routine testing of Vitamin D and follow-up with our clinic in 2 weeks.  At risk for osteopenia and osteoporosis Rebecca Mcneil was given extended  (15 minutes) osteoporosis prevention counseling today. Rebecca Mcneil is at risk for osteopenia and osteoporosis due to her Vitamin D deficiency. She was encouraged to take her Vitamin D and follow her higher calcium diet and increase strengthening exercise to help strengthen her bones and decrease her risk of osteopenia and osteoporosis.  Irregular Menses Rebecca Mcneil will continue her current regimen and follow-up as directed.  Depression with Emotional Eating Behaviors We discussed behavior modification techniques today to help Rebecca Mcneil deal  with her emotional eating and depression. Rebecca Mcneil will be offered and appointment with Dr. Mallie Mussel, our bariatric psychologist, for evaluation.  Depression Screen Rebecca Mcneil had a moderately positive depression screening. Depression is commonly associated with obesity and often results in emotional eating behaviors. We will monitor this closely and work on CBT to help improve the non-hunger eating patterns. Referral to Psychology may be required if no improvement is seen as she continues in our clinic.  Hypothyroidism Rebecca Mcneil was informed of the importance of good thyroid control to help with weight loss efforts. She was also informed that supertheraputic thyroid levels are dangerous and will not improve weight loss results. She will have labs checked and follow-up as directed for review of lab results.  Obesity Rebecca Mcneil is currently in the action stage of change and her goal is to continue with weight loss efforts She has agreed to follow the Category 4 plan. Rebecca Mcneil has been instructed to exercise as tolerated for weight loss and overall health benefits. We discussed the following Behavioral Modification Strategies today: increasing lean protein intake, decreasing simple carbohydrates, increasing vegetables, increase H20 intake, decrease liquid calories, work on meal planning and easy cooking plans, emotional eating strategies, and holiday eating strategies.  Rebecca Mcneil has agreed to follow-up with our clinic in 2 weeks. She was informed of the importance of frequent follow-up visits to  maximize her success with intensive lifestyle modifications for her multiple health conditions. She was informed we would discuss her lab results at her next visit unless there is a critical issue that needs to be addressed sooner. Rebecca Mcneil agreed to keep her next visit at the agreed upon time to discuss these results.  ALLERGIES: No Known Allergies  MEDICATIONS: Current Outpatient Medications on File Prior to Visit    Medication Sig Dispense Refill  . cetirizine (ZYRTEC) 10 MG tablet Take 10 mg by mouth daily.    . Cholecalciferol (VITAMIN D3) 125 MCG (5000 UT) CAPS Take by mouth.    . levothyroxine (SYNTHROID, LEVOTHROID) 125 MCG tablet Take by mouth.    . Multiple Vitamins-Minerals (MULTIVITAMIN ADULT PO) Take by mouth.    . Nutritional Supplements (ADULT NUTRITIONAL SUPPLEMENT PO) Take by mouth. ASEA 4 oz BID    . TRI FEMYNOR 0.18/0.215/0.25 MG-35 MCG tablet      No current facility-administered medications on file prior to visit.     PAST MEDICAL HISTORY: Past Medical History:  Diagnosis Date  . Back pain   . Constipation   . Heartburn   . Hypothyroidism   . Joint pain   . Palpitations   . Vitamin D deficiency     PAST SURGICAL HISTORY: Past Surgical History:  Procedure Laterality Date  . WISDOM TOOTH EXTRACTION  2000    SOCIAL HISTORY: Social History   Tobacco Use  . Smoking status: Never Smoker  . Smokeless tobacco: Never Used  Substance Use Topics  . Alcohol use: Not on file  . Drug use: Not on file    FAMILY HISTORY: Family History  Problem Relation Age of Onset  . Hypertension Mother   . Thyroid disease Mother   . Obesity Father    ROS: Review of Systems  Constitutional: Positive for malaise/fatigue.  Cardiovascular: Negative for palpitations and orthopnea.  Gastrointestinal: Positive for constipation. Negative for nausea and vomiting.  Musculoskeletal:       Negative for muscle weakness.  Endo/Heme/Allergies:       Negative for hot/cold intolerance.  Psychiatric/Behavioral: Positive for depression (emotional eating). Negative for suicidal ideas.       Negative for homicidal ideas.   PHYSICAL EXAM: Blood pressure 131/82, pulse 81, temperature 98.1 F (36.7 C), temperature source Oral, height 5' 6"  (1.676 m), weight 244 lb (110.7 kg), last menstrual period 07/03/2019, SpO2 100 %. Body mass index is 39.38 kg/m. Physical Exam Vitals signs reviewed.   Constitutional:      Appearance: Normal appearance. She is well-developed. She is obese.  HENT:     Head: Normocephalic and atraumatic.     Nose: Nose normal.  Eyes:     General: No scleral icterus. Neck:     Musculoskeletal: Normal range of motion.  Cardiovascular:     Rate and Rhythm: Normal rate and regular rhythm.  Pulmonary:     Effort: Pulmonary effort is normal. No respiratory distress.  Abdominal:     Palpations: Abdomen is soft.     Tenderness: There is no abdominal tenderness.  Musculoskeletal: Normal range of motion.     Comments: Range of motion normal in all four extremities.  Skin:    General: Skin is warm and dry.  Neurological:     Mental Status: She is alert and oriented to person, place, and time.     Coordination: Coordination normal.  Psychiatric:        Mood and Affect: Mood and affect normal.  Behavior: Behavior normal.   RECENT LABS AND TESTS: BMET No results found for: NA, K, CL, CO2, GLUCOSE, BUN, CREATININE, CALCIUM, GFRNONAA, GFRAA No results found for: HGBA1C No results found for: INSULIN CBC No results found for: WBC, RBC, HGB, HCT, PLT, MCV, MCH, MCHC, RDW, LYMPHSABS, MONOABS, EOSABS, BASOSABS Iron/TIBC/Ferritin/ %Sat No results found for: IRON, TIBC, FERRITIN, IRONPCTSAT Lipid Panel  No results found for: CHOL, TRIG, HDL, CHOLHDL, VLDL, LDLCALC, LDLDIRECT Hepatic Function Panel  No results found for: PROT, ALBUMIN, AST, ALT, ALKPHOS, BILITOT, BILIDIR, IBILI No results found for: TSH  No results found for Vitamin D, 25-Hydroxy  ECG  shows Sinus  Rhythm with a rate of 84 BPM. Low voltage in precordial leads. RSR(V1) -nondiagnostic. Nonspecific T-abnormality. Nondiagnostic for age. Abnormal.  INDIRECT CALORIMETER done today shows a VO2 of 369 and a REE of 2566. Her calculated basal metabolic rate is 7341 thus her basal metabolic rate is better than expected.  OBESITY BEHAVIORAL INTERVENTION VISIT  Today's visit was #1  Starting  weight: 244 lbs Starting date: 07/28/2019 Today's weight: 244 lbs  Today's date: 07/28/2019 Total lbs lost to date: 0    07/28/2019  Height 5' 6"  (1.676 m)  Weight 244 lb (110.7 kg)  BMI (Calculated) 39.4  BLOOD PRESSURE - SYSTOLIC 937  BLOOD PRESSURE - DIASTOLIC 82  Waist Measurement  50 inches   Body Fat % 47.9 %  Total Body Water (lbs) 92 lbs  RMR 2566   ASK: We discussed the diagnosis of obesity with Brigid Re today and Kaileigh agreed to give [place] permission to discuss obesity behavioral modification therapy today.  ASSESS: Orlinda has the diagnosis of obesity and her BMI today is 39.4. Jaysha is in the action stage of change.   ADVISE: Kashawn was educated on the multiple health risks of obesity as well as the benefit of weight loss to improve her health. She was advised of the need for long term treatment and the importance of lifestyle modifications to improve her current health and to decrease her risk of future health problems.  AGREE: Multiple dietary modification options and treatment options were discussed and  Keryn agreed to follow the recommendations documented in the above note.  ARRANGE: Adina was educated on the importance of frequent visits to treat obesity as outlined per CMS and USPSTF guidelines and agreed to schedule her next follow up appointment today.  IMichaelene Song, am acting as Location manager for [employer]. Juleen China, DO  I have reviewed the above documentation for accuracy and completeness, and I agree with the above. Briscoe Deutscher, DO

## 2019-07-29 LAB — CBC WITH DIFFERENTIAL/PLATELET
Basophils Absolute: 0 10*3/uL (ref 0.0–0.2)
Basos: 0 %
EOS (ABSOLUTE): 0.1 10*3/uL (ref 0.0–0.4)
Eos: 1 %
Hematocrit: 40.6 % (ref 34.0–46.6)
Hemoglobin: 13.3 g/dL (ref 11.1–15.9)
Immature Grans (Abs): 0 10*3/uL (ref 0.0–0.1)
Immature Granulocytes: 0 %
Lymphocytes Absolute: 3 10*3/uL (ref 0.7–3.1)
Lymphs: 43 %
MCH: 28.3 pg (ref 26.6–33.0)
MCHC: 32.8 g/dL (ref 31.5–35.7)
MCV: 86 fL (ref 79–97)
Monocytes Absolute: 0.5 10*3/uL (ref 0.1–0.9)
Monocytes: 6 %
Neutrophils Absolute: 3.5 10*3/uL (ref 1.4–7.0)
Neutrophils: 50 %
Platelets: 338 10*3/uL (ref 150–450)
RBC: 4.7 x10E6/uL (ref 3.77–5.28)
RDW: 12 % (ref 11.7–15.4)
WBC: 7.2 10*3/uL (ref 3.4–10.8)

## 2019-07-29 LAB — LIPID PANEL WITH LDL/HDL RATIO
Cholesterol, Total: 174 mg/dL (ref 100–199)
HDL: 50 mg/dL (ref >39)
LDL Chol Calc (NIH): 105 mg/dL — ABNORMAL HIGH (ref 0–99)
LDL/HDL Ratio: 2.1 ratio (ref 0.0–3.2)
Triglycerides: 104 mg/dL (ref 0–149)
VLDL Cholesterol Cal: 19 mg/dL (ref 5–40)

## 2019-07-29 LAB — COMPREHENSIVE METABOLIC PANEL
ALT: 21 IU/L (ref 0–32)
AST: 18 IU/L (ref 0–40)
Albumin/Globulin Ratio: 1.7 (ref 1.2–2.2)
Albumin: 4.1 g/dL (ref 3.8–4.8)
Alkaline Phosphatase: 101 IU/L (ref 39–117)
BUN/Creatinine Ratio: 20 (ref 9–23)
BUN: 15 mg/dL (ref 6–20)
Bilirubin Total: 0.3 mg/dL (ref 0.0–1.2)
CO2: 23 mmol/L (ref 20–29)
Calcium: 9.3 mg/dL (ref 8.7–10.2)
Chloride: 107 mmol/L — ABNORMAL HIGH (ref 96–106)
Creatinine, Ser: 0.74 mg/dL (ref 0.57–1.00)
GFR calc Af Amer: 124 mL/min/{1.73_m2} (ref >59)
GFR calc non Af Amer: 108 mL/min/{1.73_m2} (ref >59)
Globulin, Total: 2.4 g/dL (ref 1.5–4.5)
Glucose: 91 mg/dL (ref 65–99)
Potassium: 4.4 mmol/L (ref 3.5–5.2)
Sodium: 142 mmol/L (ref 134–144)
Total Protein: 6.5 g/dL (ref 6.0–8.5)

## 2019-07-29 LAB — INSULIN, RANDOM: INSULIN: 18.4 u[IU]/mL (ref 2.6–24.9)

## 2019-07-29 LAB — T3: T3, Total: 103 ng/dL (ref 71–180)

## 2019-07-29 LAB — HEMOGLOBIN A1C
Est. average glucose Bld gHb Est-mCnc: 97 mg/dL
Hgb A1c MFr Bld: 5 % (ref 4.8–5.6)

## 2019-07-29 LAB — TSH: TSH: 5.65 u[IU]/mL — ABNORMAL HIGH (ref 0.450–4.500)

## 2019-07-29 LAB — T4, FREE: Free T4: 1.08 ng/dL (ref 0.82–1.77)

## 2019-07-29 LAB — VITAMIN B12: Vitamin B-12: 293 pg/mL (ref 232–1245)

## 2019-07-29 LAB — VITAMIN D 25 HYDROXY (VIT D DEFICIENCY, FRACTURES): Vit D, 25-Hydroxy: 33 ng/mL (ref 30.0–100.0)

## 2019-08-10 ENCOUNTER — Other Ambulatory Visit: Payer: Self-pay

## 2019-08-10 ENCOUNTER — Encounter (INDEPENDENT_AMBULATORY_CARE_PROVIDER_SITE_OTHER): Payer: Self-pay | Admitting: Family Medicine

## 2019-08-10 ENCOUNTER — Ambulatory Visit (INDEPENDENT_AMBULATORY_CARE_PROVIDER_SITE_OTHER): Payer: 59 | Admitting: Family Medicine

## 2019-08-10 VITALS — BP 118/80 | HR 82 | Temp 98.3°F | Ht 66.0 in | Wt 236.0 lb

## 2019-08-10 DIAGNOSIS — E559 Vitamin D deficiency, unspecified: Secondary | ICD-10-CM

## 2019-08-10 DIAGNOSIS — E8881 Metabolic syndrome: Secondary | ICD-10-CM

## 2019-08-10 DIAGNOSIS — Z6838 Body mass index (BMI) 38.0-38.9, adult: Secondary | ICD-10-CM

## 2019-08-10 DIAGNOSIS — E038 Other specified hypothyroidism: Secondary | ICD-10-CM | POA: Diagnosis not present

## 2019-08-10 DIAGNOSIS — Z9189 Other specified personal risk factors, not elsewhere classified: Secondary | ICD-10-CM | POA: Diagnosis not present

## 2019-08-10 DIAGNOSIS — E538 Deficiency of other specified B group vitamins: Secondary | ICD-10-CM

## 2019-08-10 MED ORDER — VITAMIN D (ERGOCALCIFEROL) 1.25 MG (50000 UNIT) PO CAPS: 50000.0000 [IU] | ORAL_CAPSULE | ORAL | 0 refills | Status: DC

## 2019-08-10 MED ORDER — LEVOTHYROXINE SODIUM 150 MCG PO TABS: 150.0000 ug | ORAL_TABLET | Freq: Every day | ORAL | 0 refills | Status: DC

## 2019-08-11 NOTE — Progress Notes (Signed)
Office: [phone]  /  Fax: [phone]   HPI:   Chief Complaint: OBESITY Rebecca Mcneil is here to discuss her progress with her obesity treatment plan. She is on the Category 4 plan and is following her eating plan approximately 75 % of the time. She states she is exercising 0 minutes 0 times per week. Rebecca Mcneil is doing well but she struggles to eat all of her dinner protein.  Her weight is 236 lb (107 kg) today and has had a weight loss of 8 pounds over a period of 2 weeks since her last visit. She has lost 8 lbs since starting treatment with [place].  Vitamin D Deficiency Rebecca Mcneil has a diagnosis of vitamin D deficiency. She is not currently taking Vit D. Last Vit D level was 33. She denies nausea, vomiting or muscle weakness.  Vitamin B12 Deficiency Rebecca Mcneil has a diagnosis of B12 insufficiency. Last Vitamin B12 was 293. She hs high B vitamin load in her current diet.  She is not a vegetarian and does not have a previous diagnosis of pernicious anemia. She does not have a history of weight loss surgery.   Insulin Resistance Rebecca Mcneil has a diagnosis of insulin resistance based on her elevated fasting insulin level >5. Last insulin level was 18.4. Although Rebecca Mcneil's blood glucose readings are still under good control, insulin resistance puts her at greater risk of metabolic syndrome and diabetes. She is not taking metformin currently and continues to work on diet and exercise to decrease risk of diabetes.  At risk for diabetes Rebecca Mcneil is at higher than average risk for developing diabetes due to her obesity and insulin resistance. She currently denies polyuria or polydipsia.  Hypothyroidism Rebecca Mcneil has a diagnosis of hypothyroidism. Last TSH was 5.650, 02/11/2019 1.9. She is currently on levothyroxine. She denies hot or cold intolerance or palpitations, but does admit to ongoing fatigue.  ASSESSMENT AND PLAN:  Vitamin D deficiency - Plan: Vitamin D, Ergocalciferol, (DRISDOL) 1.25 MG (50000 UT) CAPS  capsule  Vitamin B 12 deficiency  Insulin resistance  Other specified hypothyroidism - Plan: levothyroxine (SYNTHROID) 150 MCG tablet  At risk for diabetes mellitus  Class 2 severe obesity with serious comorbidity and body mass index (BMI) of 38.0 to 38.9 in adult, unspecified obesity type ([place])  PLAN:  Vitamin D Deficiency Rebecca Mcneil was informed that low vitamin D levels contributes to fatigue and are associated with obesity, breast, and colon cancer. [name] agrees to start prescription Vit D 50,000 IU every week #4 with no refills. She will follow up for routine testing of vitamin D, at least 2-3 times per year. She was informed of the risk of over-replacement of vitamin D and agrees to not increase her dose unless she discusses this with [place] first. [name] agrees to follow up with our clinic in 2 weeks.  Vitamin B12 Deficiency Rebecca Mcneil will work on increasing B12 rich foods in her diet. B12 supplementation was not prescribed today. We will continue to monitor.  Insulin Resistance Rebecca Mcneil will continue to work on weight loss, exercise, and decreasing simple carbohydrates in her diet to help decrease the risk of diabetes. We dicussed metformin including benefits and risks. She was informed that eating too many simple carbohydrates or too many calories at one sitting increases the likelihood of GI side effects. [name] will start metformin if she is stable. [name] agrees to follow up with our clinic in 2 weeks as directed to monitor her progress.  Diabetes risk counseling Rebecca Mcneil was given extended (15 minutes) diabetes  prevention counseling today. She is 33 y.o. female and has risk factors for diabetes including obesity and insulin resistance. We discussed intensive lifestyle modifications today with an emphasis on weight loss as well as increasing exercise and decreasing simple carbohydrates in her diet.  Hypothyroidism Rebecca Mcneil was informed of the importance of good thyroid control to help with  weight loss efforts. She was also informed that supertheraputic thyroid levels are dangerous and will not improve weight loss results. Rebecca Mcneil agrees to increase levothyroxine to 150 mcg PO daily #30 with no refills. Rebecca Mcneil agrees to follow up with our clinic in 2 weeks.  Obesity Rebecca Mcneil is currently in the action stage of change. As such, her goal is to continue with weight loss efforts She has agreed to follow the Category 4 plan Rebecca Mcneil has been instructed to work up to a goal of 150 minutes of combined cardio and strengthening exercise per week or as tolerated for weight loss and overall health benefits. We discussed the following Behavioral Modification Strategies today: increasing lean protein intake, decreasing simple carbohydrates, increasing vegetables, increase H20 intake, work on meal planning and easy cooking plans, holiday eating strategies, celebration eating strategies Rebecca Mcneil will move 2-3 oz of dinner protein to breakfast or lunch or snack.  Rebecca Mcneil has agreed to follow up with our clinic in 2 weeks. She was informed of the importance of frequent follow up visits to maximize her success with intensive lifestyle modifications for her multiple health conditions.  ALLERGIES: No Known Allergies  MEDICATIONS: Current Outpatient Medications on File Prior to Visit  Medication Sig Dispense Refill  . cetirizine (ZYRTEC) 10 MG tablet Take 10 mg by mouth daily.    . Cholecalciferol (VITAMIN D3) 125 MCG (5000 UT) CAPS Take by mouth.    . Multiple Vitamins-Minerals (MULTIVITAMIN ADULT PO) Take by mouth.    . Nutritional Supplements (ADULT NUTRITIONAL SUPPLEMENT PO) Take by mouth. ASEA 4 oz BID    . TRI FEMYNOR 0.18/0.215/0.25 MG-35 MCG tablet      No current facility-administered medications on file prior to visit.     PAST MEDICAL HISTORY: Past Medical History:  Diagnosis Date  . Back pain   . Constipation   . Heartburn   . Hypothyroidism   . Joint pain   . Palpitations   .  Vitamin D deficiency     PAST SURGICAL HISTORY: Past Surgical History:  Procedure Laterality Date  . WISDOM TOOTH EXTRACTION  2000    SOCIAL HISTORY: Social History   Tobacco Use  . Smoking status: Never Smoker  . Smokeless tobacco: Never Used  Substance Use Topics  . Alcohol use: Not on file  . Drug use: Not on file    FAMILY HISTORY: Family History  Problem Relation Age of Onset  . Hypertension Mother   . Thyroid disease Mother   . Obesity Father     ROS: Review of Systems  Constitutional: Positive for weight loss.  Cardiovascular: Negative for palpitations.  Gastrointestinal: Negative for nausea and vomiting.  Genitourinary: Negative for frequency.  Musculoskeletal:       Negative muscle weakness  Endo/Heme/Allergies: Negative for polydipsia.       Negative hot/cold intolerance    PHYSICAL EXAM: Blood pressure 118/80, pulse 82, temperature 98.3 F (36.8 C), temperature source Oral, height 5' 6"  (1.676 m), weight 236 lb (107 kg), SpO2 100 %. Body mass index is 38.09 kg/m. Physical Exam Vitals signs reviewed.  Constitutional:      Appearance: Normal appearance. She is obese.  Cardiovascular:  Rate and Rhythm: Normal rate.     Pulses: Normal pulses.  Pulmonary:     Effort: Pulmonary effort is normal.     Breath sounds: Normal breath sounds.  Musculoskeletal: Normal range of motion.  Skin:    General: Skin is warm and dry.  Neurological:     Mental Status: She is alert and oriented to person, place, and time.  Psychiatric:        Mood and Affect: Mood normal.        Behavior: Behavior normal.     RECENT LABS AND TESTS: BMET    Component Value Date/Time   NA 142 07/28/2019 0832   K 4.4 07/28/2019 0832   CL 107 (H) 07/28/2019 0832   CO2 23 07/28/2019 0832   GLUCOSE 91 07/28/2019 0832   BUN 15 07/28/2019 0832   CREATININE 0.74 07/28/2019 0832   CALCIUM 9.3 07/28/2019 0832   GFRNONAA 108 07/28/2019 0832   GFRAA 124 07/28/2019 0832   Lab  Results  Component Value Date   HGBA1C 5.0 07/28/2019   Lab Results  Component Value Date   INSULIN 18.4 07/28/2019   CBC    Component Value Date/Time   WBC 7.2 07/28/2019 0832   RBC 4.70 07/28/2019 0832   HGB 13.3 07/28/2019 0832   HCT 40.6 07/28/2019 0832   PLT 338 07/28/2019 0832   MCV 86 07/28/2019 0832   MCH 28.3 07/28/2019 0832   MCHC 32.8 07/28/2019 0832   RDW 12.0 07/28/2019 0832   LYMPHSABS 3.0 07/28/2019 0832   EOSABS 0.1 07/28/2019 0832   BASOSABS 0.0 07/28/2019 0832   Iron/TIBC/Ferritin/ %Sat No results found for: IRON, TIBC, FERRITIN, IRONPCTSAT Lipid Panel     Component Value Date/Time   CHOL 174 07/28/2019 0832   TRIG 104 07/28/2019 0832   HDL 50 07/28/2019 0832   LDLCALC 105 (H) 07/28/2019 0832   Hepatic Function Panel     Component Value Date/Time   PROT 6.5 07/28/2019 0832   ALBUMIN 4.1 07/28/2019 0832   AST 18 07/28/2019 0832   ALT 21 07/28/2019 0832   ALKPHOS 101 07/28/2019 0832   BILITOT 0.3 07/28/2019 0832      Component Value Date/Time   TSH 5.650 (H) 07/28/2019 0832      OBESITY BEHAVIORAL INTERVENTION VISIT  Today's visit was # 2   Starting weight: 244 lbs Starting date: 07/28/2019 Today's weight : 236 lbs Today's date: 08/10/2019 Total lbs lost to date: 8    ASK: We discussed the diagnosis of obesity with Rebecca Mcneil today and Rebecca Mcneil agreed to give [place] permission to discuss obesity behavioral modification therapy today.  ASSESS: Rebecca Mcneil has the diagnosis of obesity and her BMI today is 38.11 Rebecca Mcneil is in the action stage of change   ADVISE: Azeneth was educated on the multiple health risks of obesity as well as the benefit of weight loss to improve her health. She was advised of the need for long term treatment and the importance of lifestyle modifications to improve her current health and to decrease her risk of future health problems.  AGREE: Multiple dietary modification options and treatment options were  discussed and  Rebecca Mcneil agreed to follow the recommendations documented in the above note.  ARRANGE: Rebecca Mcneil was educated on the importance of frequent visits to treat obesity as outlined per CMS and USPSTF guidelines and agreed to schedule her next follow up appointment today.  Rebecca Mcneil, am acting as transcriptionist for Rebecca Deutscher, DO  I have reviewed  the above documentation for accuracy and completeness, and I agree with the above. Rebecca Deutscher, DO

## 2019-08-16 ENCOUNTER — Encounter (INDEPENDENT_AMBULATORY_CARE_PROVIDER_SITE_OTHER): Payer: Self-pay | Admitting: Family Medicine

## 2019-08-21 ENCOUNTER — Encounter (INDEPENDENT_AMBULATORY_CARE_PROVIDER_SITE_OTHER): Payer: Self-pay | Admitting: Family Medicine

## 2019-08-24 ENCOUNTER — Encounter (INDEPENDENT_AMBULATORY_CARE_PROVIDER_SITE_OTHER): Payer: Self-pay | Admitting: Family Medicine

## 2019-08-24 ENCOUNTER — Telehealth (INDEPENDENT_AMBULATORY_CARE_PROVIDER_SITE_OTHER): Payer: 59 | Admitting: Family Medicine

## 2019-08-24 ENCOUNTER — Other Ambulatory Visit: Payer: Self-pay

## 2019-08-24 DIAGNOSIS — E8881 Metabolic syndrome: Secondary | ICD-10-CM

## 2019-08-24 DIAGNOSIS — E559 Vitamin D deficiency, unspecified: Secondary | ICD-10-CM | POA: Diagnosis not present

## 2019-08-24 DIAGNOSIS — Z6838 Body mass index (BMI) 38.0-38.9, adult: Secondary | ICD-10-CM

## 2019-08-24 DIAGNOSIS — E038 Other specified hypothyroidism: Secondary | ICD-10-CM | POA: Diagnosis not present

## 2019-08-24 DIAGNOSIS — E538 Deficiency of other specified B group vitamins: Secondary | ICD-10-CM | POA: Diagnosis not present

## 2019-08-24 MED ORDER — VITAMIN D (ERGOCALCIFEROL) 1.25 MG (50000 UNIT) PO CAPS: 50000.0000 [IU] | ORAL_CAPSULE | ORAL | 0 refills | Status: DC

## 2019-08-24 MED ORDER — LEVOTHYROXINE SODIUM 150 MCG PO TABS: 150.0000 ug | ORAL_TABLET | Freq: Every day | ORAL | 0 refills | Status: DC

## 2019-08-25 NOTE — Progress Notes (Signed)
Office: [phone]  /  Fax: [phone] TeleHealth Visit:  Rebecca Mcneil has verbally consented to this TeleHealth visit today. The patient is located at home, the provider is located at the [employer] [facility] office. The participants in this visit include the listed provider and patient. The visit was conducted today via [email or web address] (28 minutes).  HPI:   Chief Complaint: OBESITY Rebecca Mcneil is here to discuss her progress with her obesity treatment plan. She is on the Category 4 plan and is following her eating plan approximately 75 % of the time. She states she is exercising 0 minutes 0 times per week. Marlisha is still struggling to in all of her protein. She has family members who are positive for COVID, she is waiting for her test results. She was switched to virtual visit due to [place] exposure.  We were unable to weigh the patient today for this TeleHealth visit. She feels as if she has lost weight since her last visit. She has lost 8 lbs since starting treatment with [place].  Vitamin D Deficiency [name] has a diagnosis of vitamin D deficiency. She is currently taking prescription Vit D and denies nausea, vomiting or muscle weakness.  Vitamin B12 Deficiency [name] has a diagnosis of B12 insufficiency and notes fatigue. She is not a vegetarian and does not have a previous diagnosis of pernicious anemia. She does not have a history of weight loss surgery.   Insulin Resistance [name] has a diagnosis of insulin resistance based on her elevated fasting insulin level >5. Although [name]'s blood glucose readings are still under good control, insulin resistance puts her at greater risk of metabolic syndrome and diabetes. She is not taking metformin currently and continues to work on diet and exercise to decrease risk of diabetes.  Hypothyroidism [name] has a diagnosis of hypothyroidism. We changed her levothyroxine dose at her last visit. She feels more energy. She denies hot or cold  intolerance or palpitations.  ASSESSMENT AND PLAN:  Vitamin D deficiency - Plan: Vitamin D, Ergocalciferol, (DRISDOL) 1.25 MG (50000 UT) CAPS capsule  Vitamin B 12 deficiency  Insulin resistance  Other specified hypothyroidism - Plan: levothyroxine (SYNTHROID) 150 MCG tablet  Class 2 severe obesity with serious comorbidity and body mass index (BMI) of 38.0 to 38.9 in adult, unspecified obesity type (HCC)  PLAN:  Vitamin D Deficiency Low vitamin D level contributes to fatigue and are associated with obesity, breast, and colon cancer. [name] agrees to continue taking prescription Vit D 50,000 IU every week #4 and we will refill for 1 month. She will follow up for routine testing of vitamin D, at least 2-3 times per year to avoid over-replacement. [name] agrees to follow up with our clinic in 2 weeks.  Vitamin B12 Deficiency [name] will continue her diet and will work on increasing B12 rich foods in her diet. B12 supplementation was not prescribed today.   Insulin Resistance [name] will continue to work on weight loss, exercise, and decreasing simple carbohydrates to help decrease the risk of diabetes. [name] agrees to follow up with [place] as directed to closely monitor her progress.  Hypothyroidism [name] was informed of the importance of good thyroid control for overall health and that supertheraputic thyroid levels are dangerous and will not improve weight loss results. [name] agrees to continue taking levothyroxine 150 mcg PO q AM #30 and we will refill for 1 month. We will recheck labs in 4 weeks. Sheliah agrees to follow up with our clinic in 2 weeks.  Obesity  [name] is currently in the action stage of change. As such, her goal is to continue with weight loss efforts She has agreed to follow the Category 4 plan [name] has been instructed to work up to a goal of 150 minutes of combined cardio and strengthening exercise per week for weight loss and overall health benefits. We discussed  the following Behavioral Modification Strategies today: increasing lean protein intake, decreasing simple carbohydrates, increasing vegetables, increase H20 intake, work on meal planning and easy cooking plans and decrease liquid calories   [name] has agreed to follow up with our clinic in 2 weeks with myself. She was informed of the importance of frequent follow up visits to maximize her success with intensive lifestyle modifications for her multiple health conditions.  ALLERGIES: No Known Allergies  MEDICATIONS: Current Outpatient Medications on File Prior to Visit  Medication Sig Dispense Refill  . cetirizine (ZYRTEC) 10 MG tablet Take 10 mg by mouth daily.    . Cholecalciferol (VITAMIN D3) 125 MCG (5000 UT) CAPS Take by mouth.    . Multiple Vitamins-Minerals (MULTIVITAMIN ADULT PO) Take by mouth.    . Nutritional Supplements (ADULT NUTRITIONAL SUPPLEMENT PO) Take by mouth. ASEA 4 oz BID    . TRI FEMYNOR 0.18/0.215/0.25 MG-35 MCG tablet      No current facility-administered medications on file prior to visit.     PAST MEDICAL HISTORY: Past Medical History:  Diagnosis Date  . Back pain   . Constipation   . Heartburn   . Hypothyroidism   . Joint pain   . Palpitations   . Vitamin D deficiency     PAST SURGICAL HISTORY: Past Surgical History:  Procedure Laterality Date  . WISDOM TOOTH EXTRACTION  2000    SOCIAL HISTORY: Social History   Tobacco Use  . Smoking status: Never Smoker  . Smokeless tobacco: Never Used  Substance Use Topics  . Alcohol use: Not on file  . Drug use: Not on file    FAMILY HISTORY: Family History  Problem Relation Age of Onset  . Hypertension Mother   . Thyroid disease Mother   . Obesity Father     ROS: Review of Systems  Constitutional: Positive for weight loss.  Cardiovascular: Negative for palpitations.  Gastrointestinal: Negative for nausea and vomiting.  Musculoskeletal:       Negative muscle weakness  Endo/Heme/Allergies:        Negative hot/cold intolerance    PHYSICAL EXAM: Pt in no acute distress  RECENT LABS AND TESTS: BMET    Component Value Date/Time   NA 142 07/28/2019 0832   K 4.4 07/28/2019 0832   CL 107 (H) 07/28/2019 0832   CO2 23 07/28/2019 0832   GLUCOSE 91 07/28/2019 0832   BUN 15 07/28/2019 0832   CREATININE 0.74 07/28/2019 0832   CALCIUM 9.3 07/28/2019 0832   GFRNONAA 108 07/28/2019 0832   GFRAA 124 07/28/2019 0832   Lab Results  Component Value Date   HGBA1C 5.0 07/28/2019   Lab Results  Component Value Date   INSULIN 18.4 07/28/2019   CBC    Component Value Date/Time   WBC 7.2 07/28/2019 0832   RBC 4.70 07/28/2019 0832   HGB 13.3 07/28/2019 0832   HCT 40.6 07/28/2019 0832   PLT 338 07/28/2019 0832   MCV 86 07/28/2019 0832   MCH 28.3 07/28/2019 0832   MCHC 32.8 07/28/2019 0832   RDW 12.0 07/28/2019 0832   LYMPHSABS 3.0 07/28/2019 0832   EOSABS 0.1 07/28/2019 8299  BASOSABS 0.0 07/28/2019 0832   Iron/TIBC/Ferritin/ %Sat No results found for: IRON, TIBC, FERRITIN, IRONPCTSAT Lipid Panel     Component Value Date/Time   CHOL 174 07/28/2019 0832   TRIG 104 07/28/2019 0832   HDL 50 07/28/2019 0832   LDLCALC 105 (H) 07/28/2019 0832   Hepatic Function Panel     Component Value Date/Time   PROT 6.5 07/28/2019 0832   ALBUMIN 4.1 07/28/2019 0832   AST 18 07/28/2019 0832   ALT 21 07/28/2019 0832   ALKPHOS 101 07/28/2019 0832   BILITOT 0.3 07/28/2019 0832      Component Value Date/Time   TSH 5.650 (H) 07/28/2019 0832   I, Trixie Dredge, am acting as transcriptionist for Briscoe Deutscher, DO  I have reviewed the above documentation for accuracy and completeness, and I agree with the above. Briscoe Deutscher, DO

## 2019-09-07 ENCOUNTER — Encounter (INDEPENDENT_AMBULATORY_CARE_PROVIDER_SITE_OTHER): Payer: Self-pay | Admitting: Family Medicine

## 2019-09-07 ENCOUNTER — Telehealth (INDEPENDENT_AMBULATORY_CARE_PROVIDER_SITE_OTHER): Payer: 59 | Admitting: Family Medicine

## 2019-09-07 ENCOUNTER — Other Ambulatory Visit: Payer: Self-pay

## 2019-09-07 DIAGNOSIS — Z6838 Body mass index (BMI) 38.0-38.9, adult: Secondary | ICD-10-CM | POA: Diagnosis not present

## 2019-09-07 DIAGNOSIS — E038 Other specified hypothyroidism: Secondary | ICD-10-CM | POA: Diagnosis not present

## 2019-09-07 DIAGNOSIS — E8881 Metabolic syndrome: Secondary | ICD-10-CM | POA: Diagnosis not present

## 2019-09-07 DIAGNOSIS — E559 Vitamin D deficiency, unspecified: Secondary | ICD-10-CM | POA: Diagnosis not present

## 2019-09-07 DIAGNOSIS — E88819 Insulin resistance, unspecified: Secondary | ICD-10-CM

## 2019-09-07 MED FILL — LEVOTHYROXINE SODIUM 150 MC: 150 | 30 days supply | Qty: 30 | Fill #0

## 2019-09-07 MED FILL — TRI FEMYNOR 28 TABLET: 0.18/0.215/ | 84 days supply | Qty: 84 | Fill #1

## 2019-09-07 MED FILL — VIT D2 1.25 MG (50,000 UNIT: 1.25 MG | 28 days supply | Qty: 4 | Fill #0

## 2019-09-13 MED ORDER — VITAMIN D (ERGOCALCIFEROL) 1.25 MG (50000 UNIT) PO CAPS: 50000.0000 [IU] | ORAL_CAPSULE | ORAL | 0 refills | Status: DC

## 2019-09-13 NOTE — Progress Notes (Signed)
Office: [phone]  /  Fax: [phone] TeleHealth Visit:  Rebecca Mcneil has verbally consented to this TeleHealth visit today. The patient is located at home, the provider is located at the [employer] [facility] office. The participants in this visit include the listed provider and patient. The visit was conducted today via [email or web address] (26 minutes).  HPI:  Chief Complaint: OBESITY Rebecca Mcneil is here to discuss her progress with her obesity treatment plan. She is on the Category 4 plan and states she is following her eating plan approximately 50 % of the time. She states she is exercising 0 minutes 0 times per week.  Rebecca Mcneil has been busy at work and she is missing lunch most days. She is going to bed at 10 pm, but she is not falling asleep until midnight. She reports her weight today is 231 lbs, down 5 lbs from her weight at 236 lbs on 08/10/2019.   Vitamin D Deficiency Rebecca Mcneil has a diagnosis of vitamin D deficiency. She is taking Vit D weekly.  Insulin Resistance Rebecca Mcneil has a diagnosis of insulin resistance. She is watching her sugar intake.  Hypothyroidism Rebecca Mcneil has a diagnosis of hypothyroidism. She is taking levothyroxine 150 mcg and feels euthyroid.  ASSESSMENT AND PLAN:  Vitamin D deficiency  Insulin resistance  Other specified hypothyroidism  Class 2 severe obesity with serious comorbidity and body mass index (BMI) of 38.0 to 38.9 in adult, unspecified obesity type (HCC)  PLAN:  Vitamin D Deficiency Low Vitamin D level contributes to fatigue and are associated with obesity, breast, and colon cancer. Rebecca Mcneil agrees to continue taking prescription Vitamin D 50,000 IU every week #4 and we will refill for 1 month. She will follow up for routine testing of vitamin D, at least 2-3 times per year to avoid over-replacement. Rebecca Mcneil agrees to follow up with [place] as directed.  Insulin Resistance Rebecca Mcneil will continue to work on diet change, weight loss, exercise, and decreasing  simple carbohydrates to help decrease the risk of diabetes. Rebecca Mcneil agrees to follow-up with [place] as directed to closely monitor her progress.  Hypothyroidism Rebecca Mcneil was informed of the importance of good thyroid control for overall health and that supratherapeutic thyroid levels are dangerous and will not improve weight loss results. Rebecca Mcneil agrees to continue her medications, and we will continue to monitor.  Obesity Rebecca Mcneil is currently in the action stage of change. As such, her goal is to continue with weight loss efforts. She has agreed to follow the Category 4 plan. Rebecca Mcneil has been instructed to work up to a goal of 150 minutes of combined cardio and strengthening exercise per week for weight loss and overall health benefits. We discussed the following Behavioral Modification Strategies today: no skipping meals, meal planning and cooking strategies and planning for success. Rebecca Mcneil is to take frozen meals to work and leave in the refrigerator. She is to add exercise.  Rebecca Mcneil has agreed to follow-up with our clinic in 3 weeks. She was informed of the importance of frequent follow-up visits to maximize her success with intensive lifestyle modifications for her multiple health conditions.  ALLERGIES: No Known Allergies  MEDICATIONS: Current Outpatient Medications on File Prior to Visit  Medication Sig Dispense Refill  . Norgestimate-Ethinyl Estradiol Triphasic 0.18/0.215/0.25 MG-35 MCG tablet Take by mouth.    . cetirizine (ZYRTEC) 10 MG tablet Take 10 mg by mouth daily.    Marland Kitchen levothyroxine (SYNTHROID) 150 MCG tablet Take 1 tablet (150 mcg total) by mouth daily before breakfast. 30 tablet 0  .  Multiple Vitamins-Minerals (MULTIVITAMIN ADULT PO) Take by mouth.    . Nutritional Supplements (ADULT NUTRITIONAL SUPPLEMENT PO) Take by mouth. ASEA 4 oz BID    . Vitamin D, Ergocalciferol, (DRISDOL) 1.25 MG (50000 UT) CAPS capsule Take 1 capsule (50,000 Units total) by mouth every 7 (seven) days. 4  capsule 0   No current facility-administered medications on file prior to visit.    PAST MEDICAL HISTORY: Past Medical History:  Diagnosis Date  . Back pain   . Constipation   . Heartburn   . Hypothyroidism   . Joint pain   . Palpitations   . Vitamin D deficiency     PAST SURGICAL HISTORY: Past Surgical History:  Procedure Laterality Date  . WISDOM TOOTH EXTRACTION  2000    SOCIAL HISTORY: Social History   Tobacco Use  . Smoking status: Never Smoker  . Smokeless tobacco: Never Used  Substance Use Topics  . Alcohol use: Not on file  . Drug use: Not on file    FAMILY HISTORY: Family History  Problem Relation Age of Onset  . Hypertension Mother   . Thyroid disease Mother   . Obesity Father     ROS: Review of Systems  Constitutional: Positive for weight loss.    PHYSICAL EXAM: There were no vitals taken for this visit. There is no height or weight on file to calculate BMI. Physical Exam General: Cooperative, alert, well developed, in no acute distress. HEENT: Conjunctivae and lids unremarkable. Neck: No thyromegaly.  Cardiovascular: Regular rhythm.  Lungs: Normal work of breathing. Neurologic: No focal deficits.   RECENT LABS AND TESTS: BMET    Component Value Date/Time   NA 142 07/28/2019 0832   K 4.4 07/28/2019 0832   CL 107 (H) 07/28/2019 0832   CO2 23 07/28/2019 0832   GLUCOSE 91 07/28/2019 0832   BUN 15 07/28/2019 0832   CREATININE 0.74 07/28/2019 0832   CALCIUM 9.3 07/28/2019 0832   GFRNONAA 108 07/28/2019 0832   GFRAA 124 07/28/2019 0832   Lab Results  Component Value Date   HGBA1C 5.0 07/28/2019   Lab Results  Component Value Date   INSULIN 18.4 07/28/2019   CBC    Component Value Date/Time   WBC 7.2 07/28/2019 0832   RBC 4.70 07/28/2019 0832   HGB 13.3 07/28/2019 0832   HCT 40.6 07/28/2019 0832   PLT 338 07/28/2019 0832   MCV 86 07/28/2019 0832   MCH 28.3 07/28/2019 0832   MCHC 32.8 07/28/2019 0832   RDW 12.0 07/28/2019  0832   LYMPHSABS 3.0 07/28/2019 0832   EOSABS 0.1 07/28/2019 0832   BASOSABS 0.0 07/28/2019 0832   Iron/TIBC/Ferritin/ %Sat No results found for: IRON, TIBC, FERRITIN, IRONPCTSAT Lipid Panel     Component Value Date/Time   CHOL 174 07/28/2019 0832   TRIG 104 07/28/2019 0832   HDL 50 07/28/2019 0832   LDLCALC 105 (H) 07/28/2019 0832   Hepatic Function Panel     Component Value Date/Time   PROT 6.5 07/28/2019 0832   ALBUMIN 4.1 07/28/2019 0832   AST 18 07/28/2019 0832   ALT 21 07/28/2019 0832   ALKPHOS 101 07/28/2019 0832   BILITOT 0.3 07/28/2019 0832      Component Value Date/Time   TSH 5.650 (H) 07/28/2019 0832    I, Trixie Dredge, am acting as transcriptionist for Briscoe Deutscher, DO  I have reviewed the above documentation for accuracy and completeness, and I agree with the above. Briscoe Deutscher, DO

## 2019-09-27 ENCOUNTER — Ambulatory Visit (INDEPENDENT_AMBULATORY_CARE_PROVIDER_SITE_OTHER): Payer: 59 | Admitting: Family Medicine

## 2019-09-27 ENCOUNTER — Other Ambulatory Visit: Payer: Self-pay

## 2019-09-27 ENCOUNTER — Encounter (INDEPENDENT_AMBULATORY_CARE_PROVIDER_SITE_OTHER): Payer: Self-pay | Admitting: Family Medicine

## 2019-09-27 VITALS — BP 130/84 | HR 96 | Temp 98.3°F | Ht 66.0 in | Wt 234.0 lb

## 2019-09-27 DIAGNOSIS — Z6837 Body mass index (BMI) 37.0-37.9, adult: Secondary | ICD-10-CM | POA: Diagnosis not present

## 2019-09-27 DIAGNOSIS — E559 Vitamin D deficiency, unspecified: Secondary | ICD-10-CM

## 2019-09-27 DIAGNOSIS — E039 Hypothyroidism, unspecified: Secondary | ICD-10-CM | POA: Diagnosis not present

## 2019-09-27 DIAGNOSIS — E8881 Metabolic syndrome: Secondary | ICD-10-CM

## 2019-09-27 DIAGNOSIS — E538 Deficiency of other specified B group vitamins: Secondary | ICD-10-CM

## 2019-09-27 DIAGNOSIS — Z9189 Other specified personal risk factors, not elsewhere classified: Secondary | ICD-10-CM

## 2019-09-27 MED ORDER — VITAMIN D (ERGOCALCIFEROL) 1.25 MG (50000 UNIT) PO CAPS: 50000.0000 [IU] | ORAL_CAPSULE | ORAL | 0 refills | Status: DC

## 2019-09-28 NOTE — Progress Notes (Signed)
Chief Complaint:   Rebecca Mcneil is here to discuss her progress with her Rebecca treatment plan along with follow-up of her Rebecca related diagnoses. Aunesti is on the Category 4 Plan and states she is following her eating plan approximately 25% of the time. 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  Today's visit was #: 5 Starting weight: 244 lbs Starting date: 07/28/2019 Today's weight: 234 lbs Today's date: 09/26/2018 Total lbs lost to date: 10 lbs Total lbs lost since last in-office visit: 2 lbs  Interim History: Mercedez was off the plan during the holidays.  She says she is ready to get back to it.  She struggles with breakfast.  States she can eat eggs, but has a hard time eating [place] bacon or sausage.    Her goal is 80% of the diet plan.  She says she will try the breakfast casserole.  Subjective:   1. Insulin resistance Arlayne has a diagnosis of insulin resistance based on her elevated fasting insulin level >5. She continues to work on diet and exercise to decrease her risk of diabetes.  No polyphagia.  Lab Results  Component Value Date   INSULIN 18.4 07/28/2019   2. Vitamin D deficiency She's Vitamin D level was 33.0 on 07/28/2019. She is currently taking vit D. She denies nausea, vomiting or muscle weakness.  3.  B12 Deficiency  Lab Results  Component Value Date   VITAMINB12 293 07/28/2019   4. Acquired hypothyroidism Taking levothyroxine 150 mcg daily.   Lab Results  Component Value Date   TSH 5.650 (H) 07/28/2019   5. At risk for diabetes mellitus Lashaye is at higher than average risk for developing diabetes due to her Rebecca.   Assessment/Plan:   1. Insulin resistance Ailsa will continue to work on weight loss, exercise, and decreasing simple carbohydrates to help decrease the risk of diabetes. Chiamaka agreed to follow-up with [place] as directed to closely monitor her progress.  2. Vitamin D deficiency Low Vitamin D level contributes  to fatigue and are associated with Rebecca, breast, and colon cancer. She agrees to continue to take prescription Vitamin D @50 ,000 IU every week and will follow-up for routine testing of vitamin D, at least 2-3 times per year to avoid over-replacement.  Orders - Vitamin D, Ergocalciferol, (DRISDOL) 1.25 MG (50000 UNIT) CAPS capsule; Take 1 capsule (50,000 Units total) by mouth every 7 (seven) days.  Dispense: 4 capsule; Refill: 0  3.  B12 Deficiency The diagnosis was reviewed with the patient. Counseling provided today, see below. We will continue to monitor. Orders and follow up as documented in patient record.    Counseling . The body needs vitamin B12: to make red blood cells; to make DNA; and to help the nerves work properly so they can carry messages from the brain to the body.  . The main causes of vitamin B12 deficiency include dietary deficiency, digestive diseases, pernicious anemia, and having a surgery in which part of the stomach or small intestine is removed.  . Certain medicines can make it harder for the body to absorb vitamin B12. These medicines include: heartburn medications; some antibiotics; some medications used to treat diabetes, gout, and high cholesterol.  . In some cases, there are no symptoms of this condition. If the condition leads to anemia or nerve damage, various symptoms can occur, such as weakness or fatigue, shortness of breath, and numbness or tingling in your hands and feet.   . Treatment:  o  May include taking vitamin B12 supplements.  o Avoid alcohol.  o Eat lots of healthy foods that contain vitamin B12: - Beef, pork, chicken, [place], and organ meats, such as liver.  - Seafood: This includes clams, rainbow trout, salmon, tuna, and haddock. Eggs.  - Cereal and dairy products that are fortified: This means that vitamin B12 has been added to the food.   4. Acquired hypothyroidism Patient with long-standing hypothyroidism, on levothyroxine therapy. She  appears euthyroid. Orders and follow up as documented in patient record.  Counseling . Good thyroid control is important for overall health. Supratherapeutic thyroid levels are dangerous and will not improve weight loss results. . The correct way to take levothyroxine is fasting, with water, separated by at least 30 minutes from breakfast, and separated by more than 4 hours from calcium, iron, multivitamins, acid reflux medications (PPIs).   5. At risk for diabetes mellitus Demetria was given approximately 15 minutes of diabetes education and counseling today. We discussed intensive lifestyle modifications today with an emphasis on weight loss as well as increasing exercise and decreasing simple carbohydrates in her diet. We also reviewed medication options with an emphasis on risk versus benefit of those discussed.   6. Class 2 severe Rebecca with serious comorbidity and body mass index (BMI) of 37.0 to 37.9 in adult, unspecified Rebecca type (HCC) Geet is currently in the action stage of change. As such, her goal is to continue with weight loss efforts. She has agreed to on the Category 4 Plan.   We discussed the following exercise goals today: For substantial health benefits, adults should do at least 150 minutes (2 hours and 30 minutes) a week of moderate-intensity, or 75 minutes (1 hour and 15 minutes) a week of vigorous-intensity aerobic physical activity, or an equivalent combination of moderate- and vigorous-intensity aerobic activity. Aerobic activity should be performed in episodes of at least 10 minutes, and preferably, it should be spread throughout the week. Adults should also include muscle-strengthening activities that involve all major muscle groups on 2 or more days a week.  We discussed the following behavioral modification strategies today: meal planning and cooking strategies.  DORINDA STEHR has agreed to follow-up with our clinic in 2 weeks. She was informed of the importance  of frequent follow-up visits to maximize her success with intensive lifestyle modifications for her multiple health conditions.   Objective:   Blood pressure 130/84, pulse 96, temperature 98.3 F (36.8 C), temperature source Oral, height 5' 6"  (1.676 m), weight 234 lb (106.1 kg), last menstrual period 09/27/2019, SpO2 99 %. Body mass index is 37.77 kg/m.  General: Cooperative, alert, well developed, in no acute distress. HEENT: Conjunctivae and lids unremarkable. Neck: No thyromegaly.  Cardiovascular: Regular rhythm.  Lungs: Normal work of breathing. Extremities: No edema.  Neurologic: No focal deficits.   Lab Results  Component Value Date   CREATININE 0.74 07/28/2019   BUN 15 07/28/2019   NA 142 07/28/2019   K 4.4 07/28/2019   CL 107 (H) 07/28/2019   CO2 23 07/28/2019   Lab Results  Component Value Date   ALT 21 07/28/2019   AST 18 07/28/2019   ALKPHOS 101 07/28/2019   BILITOT 0.3 07/28/2019   Lab Results  Component Value Date   HGBA1C 5.0 07/28/2019   Lab Results  Component Value Date   INSULIN 18.4 07/28/2019   Lab Results  Component Value Date   TSH 5.650 (H) 07/28/2019   Lab Results  Component Value Date  CHOL 174 07/28/2019   HDL 50 07/28/2019   LDLCALC 105 (H) 07/28/2019   TRIG 104 07/28/2019   Lab Results  Component Value Date   WBC 7.2 07/28/2019   HGB 13.3 07/28/2019   HCT 40.6 07/28/2019   MCV 86 07/28/2019   PLT 338 07/28/2019   Attestation Statements:   Reviewed by clinician on day of visit: allergies, medications, problem list, medical history, surgical history, family history, social history, and previous encounter notes.  I, Water quality scientist, CMA, am acting as Location manager for [employer], DO.  I have reviewed the above documentation for accuracy and completeness, and I agree with the above. Briscoe Deutscher, DO

## 2019-09-29 ENCOUNTER — Encounter (INDEPENDENT_AMBULATORY_CARE_PROVIDER_SITE_OTHER): Payer: Self-pay | Admitting: Family Medicine

## 2019-10-11 ENCOUNTER — Other Ambulatory Visit: Payer: Self-pay

## 2019-10-11 ENCOUNTER — Ambulatory Visit (INDEPENDENT_AMBULATORY_CARE_PROVIDER_SITE_OTHER): Payer: 59 | Admitting: Family Medicine

## 2019-10-11 ENCOUNTER — Encounter (INDEPENDENT_AMBULATORY_CARE_PROVIDER_SITE_OTHER): Payer: Self-pay | Admitting: Family Medicine

## 2019-10-11 VITALS — BP 110/69 | HR 88 | Temp 98.1°F | Ht 66.0 in | Wt 235.0 lb

## 2019-10-11 DIAGNOSIS — Z6838 Body mass index (BMI) 38.0-38.9, adult: Secondary | ICD-10-CM | POA: Diagnosis not present

## 2019-10-11 DIAGNOSIS — E039 Hypothyroidism, unspecified: Secondary | ICD-10-CM | POA: Diagnosis not present

## 2019-10-11 DIAGNOSIS — E538 Deficiency of other specified B group vitamins: Secondary | ICD-10-CM | POA: Diagnosis not present

## 2019-10-11 DIAGNOSIS — Z9189 Other specified personal risk factors, not elsewhere classified: Secondary | ICD-10-CM | POA: Diagnosis not present

## 2019-10-11 DIAGNOSIS — E8881 Metabolic syndrome: Secondary | ICD-10-CM | POA: Diagnosis not present

## 2019-10-11 DIAGNOSIS — E559 Vitamin D deficiency, unspecified: Secondary | ICD-10-CM | POA: Diagnosis not present

## 2019-10-11 MED ORDER — VITAMIN D (ERGOCALCIFEROL) 1.25 MG (50000 UNIT) PO CAPS: 50000.0000 [IU] | ORAL_CAPSULE | ORAL | 0 refills | Status: DC

## 2019-10-11 MED FILL — VIT D2 1.25 MG (50,000 UNIT: 1.25 MG | 28 days supply | Qty: 4 | Fill #0

## 2019-10-11 NOTE — Progress Notes (Signed)
Chief Complaint:   OBESITY Rebecca Mcneil is here to discuss her progress with her obesity treatment plan along with follow-up of her obesity related diagnoses. Rebecca Mcneil is on the Category 4 Plan and states she is following her eating plan approximately 70% of the time. Rebecca Mcneil states she is walking for 15 minutes 2 times per week.  Today's visit was #: 6 Starting weight: 244 lbs Starting date: 07/28/2019 Today's weight: 235 lbs Today's date: 10/11/2019 Total lbs lost to date: 9 lbs Total lbs lost since last in-office visit: 0  Interim History: Rebecca Mcneil has had increased fatigue over the past month.  It was worst over the weekend.  She has been skipping lunch, so she has been overeating at dinner.  She has been staying on the couch.  Epworth Sleepiness Score was 3 at initial visit.  Subjective:   1. Insulin resistance Rebecca Mcneil has a diagnosis of insulin resistance based on her elevated fasting insulin level >5. She continues to work on diet and exercise to decrease her risk of diabetes.  Lab Results  Component Value Date   INSULIN 18.4 07/28/2019   Lab Results  Component Value Date   HGBA1C 5.0 07/28/2019    2. Vitamin D deficiency Rebecca Mcneil's Vitamin D level was 33.0 on 07/28/2019. She is currently taking vit D. She denies nausea, vomiting or muscle weakness.  3. B12 deficiency She notes fatigue. She is not a vegetarian.  She does not have a previous diagnosis of pernicious anemia.  She does not have a history of weight loss surgery.   Lab Results  Component Value Date   VITAMINB12 293 07/28/2019   4. Acquired hypothyroidism Rebecca Mcneil is currently taking levothyroxine 150 mcg daily.  Lab Results  Component Value Date   TSH 0.401 (L) 10/11/2019   FREET4 1.38 10/11/2019    Lab Results  Component Value Date   TSH 0.401 (L) 10/11/2019   TSH 5.650 (H) 07/28/2019   FREET4 1.38 10/11/2019   FREET4 1.08 07/28/2019    Assessment/Plan:   1. Insulin resistance Rebecca Mcneil will continue  to work on weight loss, exercise, and decreasing simple carbohydrates to help decrease the risk of diabetes. Rebecca Mcneil agreed to follow-up with [place] as directed to closely monitor her progress.  She should increase the protein and decrease the carbohydrates in her diet.  2. Vitamin D deficiency Low Vitamin D level contributes to fatigue and are associated with obesity, breast, and colon cancer. She agrees to continue to take prescription Vitamin D @50 ,000 IU every week and will follow-up for routine testing of Vitamin D, at least 2-3 times per year to avoid over-replacement.  Orders - Vitamin D, Ergocalciferol, (DRISDOL) 1.25 MG (50000 UNIT) CAPS capsule; Take 1 capsule (50,000 Units total) by mouth every 7 (seven) days.  Dispense: 4 capsule; Refill: 0  3. B12 deficiency Rebecca Mcneil will start a vitamin B complex vitamin and we will recheck her labs at her next visit.  4. Acquired hypothyroidism Patient with long-standing hypothyroidism, on levothyroxine therapy. TSH slightly over treated since increasing to the 150 mcg dose.  Will change to 137 mcg dose. Orders and follow up as documented in patient record.  Counseling . Good thyroid control is important for overall health. Supratherapeutic thyroid levels are dangerous and will not improve weight loss results. . The correct way to take levothyroxine is fasting, with water, separated by at least 30 minutes from breakfast, and separated by more than 4 hours from calcium, iron, multivitamins, acid reflux medications (PPIs).  Orders - T3 - T4, free - TSH  5. At risk for deficient intake of food Rebecca Mcneil was given approximately 15 minutes of deficit intake of food prevention counseling today. Rebecca Mcneil is at risk for eating too few calories based on current food recall. She was encouraged to focus on meeting caloric and protein goals according to her recommended meal plan.   6. Class 2 severe obesity with serious comorbidity and body mass index (BMI) of 38.0  to 38.9 in adult, unspecified obesity type (HCC) Rebecca Mcneil is currently in the action stage of change. As such, her goal is to continue with weight loss efforts. She has agreed to the Category 4 Plan.   Exercise goals: For substantial health benefits, adults should do at least 150 minutes (2 hours and 30 minutes) a week of moderate-intensity, or 75 minutes (1 hour and 15 minutes) a week of vigorous-intensity aerobic physical activity, or an equivalent combination of moderate- and vigorous-intensity aerobic activity. Aerobic activity should be performed in episodes of at least 10 minutes, and preferably, it should be spread throughout the week. Adults should also include muscle-strengthening activities that involve all major muscle groups on 2 or more days a week.  Behavioral modification strategies: increasing lean protein intake, decreasing simple carbohydrates, increasing vegetables and increasing water intake.  Rebecca Mcneil has agreed to follow-up with our clinic in 2 weeks. She was informed of the importance of frequent follow-up visits to maximize her success with intensive lifestyle modifications for her multiple health conditions.   Rebecca Mcneil was informed we would discuss her lab results at her next visit unless there is a critical issue that needs to be addressed sooner. Rebecca Mcneil agreed to keep her next visit at the agreed upon time to discuss these results.  Objective:   Blood pressure 110/69, pulse 88, temperature 98.1 F (36.7 C), temperature source Oral, height 5' 6"  (1.676 m), weight 235 lb (106.6 kg), last menstrual period 09/27/2019, SpO2 98 %. Body mass index is 37.93 kg/m.  General: Cooperative, alert, well developed, in no acute distress. HEENT: Conjunctivae and lids unremarkable. Cardiovascular: Regular rhythm.  Lungs: Normal work of breathing. Neurologic: No focal deficits.   Lab Results  Component Value Date   CREATININE 0.74 07/28/2019   BUN 15 07/28/2019   NA 142 07/28/2019   K  4.4 07/28/2019   CL 107 (H) 07/28/2019   CO2 23 07/28/2019   Lab Results  Component Value Date   ALT 21 07/28/2019   AST 18 07/28/2019   ALKPHOS 101 07/28/2019   BILITOT 0.3 07/28/2019   Lab Results  Component Value Date   HGBA1C 5.0 07/28/2019   Lab Results  Component Value Date   INSULIN 18.4 07/28/2019   Lab Results  Component Value Date   TSH 5.650 (H) 07/28/2019   Lab Results  Component Value Date   CHOL 174 07/28/2019   HDL 50 07/28/2019   LDLCALC 105 (H) 07/28/2019   TRIG 104 07/28/2019   Lab Results  Component Value Date   WBC 7.2 07/28/2019   HGB 13.3 07/28/2019   HCT 40.6 07/28/2019   MCV 86 07/28/2019   PLT 338 07/28/2019   Attestation Statements:   Reviewed by clinician on day of visit: allergies, medications, problem list, medical history, surgical history, family history, social history, and previous encounter notes.  I, Water quality scientist, CMA, am acting as Location manager for [employer], DO.  I have reviewed the above documentation for accuracy and completeness, and I agree with the above. [name] Chen  Juleen China, DO

## 2019-10-12 LAB — T3: T3, Total: 133 ng/dL (ref 71–180)

## 2019-10-12 LAB — TSH: TSH: 0.401 u[IU]/mL — ABNORMAL LOW (ref 0.450–4.500)

## 2019-10-12 LAB — T4, FREE: Free T4: 1.38 ng/dL (ref 0.82–1.77)

## 2019-10-13 ENCOUNTER — Encounter (INDEPENDENT_AMBULATORY_CARE_PROVIDER_SITE_OTHER): Payer: Self-pay | Admitting: Family Medicine

## 2019-10-13 MED ORDER — LEVOTHYROXINE SODIUM 137 MCG PO TABS: 137.0000 ug | ORAL_TABLET | Freq: Every day | ORAL | 0 refills | Status: DC

## 2019-10-13 MED FILL — LEVOTHYROXINE 137 MCG TAB: 137 | 30 days supply | Qty: 30 | Fill #0

## 2019-10-14 NOTE — Telephone Encounter (Signed)
Please advise 

## 2019-10-14 NOTE — Telephone Encounter (Signed)
I spoke with pt and she voiced understanding. Moshe Salisbury, CMA

## 2019-10-25 ENCOUNTER — Encounter (INDEPENDENT_AMBULATORY_CARE_PROVIDER_SITE_OTHER): Payer: Self-pay | Admitting: Family Medicine

## 2019-10-25 ENCOUNTER — Ambulatory Visit (INDEPENDENT_AMBULATORY_CARE_PROVIDER_SITE_OTHER): Payer: 59 | Admitting: Family Medicine

## 2019-10-25 ENCOUNTER — Other Ambulatory Visit: Payer: Self-pay

## 2019-10-25 VITALS — BP 116/75 | HR 68 | Temp 98.3°F | Ht 66.0 in | Wt 236.0 lb

## 2019-10-25 DIAGNOSIS — E8881 Metabolic syndrome: Secondary | ICD-10-CM | POA: Diagnosis not present

## 2019-10-25 DIAGNOSIS — F3289 Other specified depressive episodes: Secondary | ICD-10-CM | POA: Diagnosis not present

## 2019-10-25 DIAGNOSIS — E559 Vitamin D deficiency, unspecified: Secondary | ICD-10-CM

## 2019-10-25 DIAGNOSIS — E038 Other specified hypothyroidism: Secondary | ICD-10-CM | POA: Diagnosis not present

## 2019-10-25 DIAGNOSIS — Z6838 Body mass index (BMI) 38.0-38.9, adult: Secondary | ICD-10-CM

## 2019-10-25 DIAGNOSIS — E538 Deficiency of other specified B group vitamins: Secondary | ICD-10-CM

## 2019-10-25 DIAGNOSIS — Z9189 Other specified personal risk factors, not elsewhere classified: Secondary | ICD-10-CM | POA: Diagnosis not present

## 2019-10-25 MED ORDER — PHENTERMINE HCL 37.5 MG PO TABS: 37.5000 mg | ORAL_TABLET | Freq: Every day | ORAL | 0 refills | Status: DC

## 2019-10-25 MED ORDER — METFORMIN HCL 500 MG PO TABS
500.0000 mg | ORAL_TABLET | Freq: Every day | ORAL | 0 refills | Status: DC
Start: 2019-10-25 — End: 2019-12-01

## 2019-10-25 MED FILL — metFORMIN HCL 500 MG TABS: 500 | 30 days supply | Qty: 30 | Fill #0

## 2019-10-25 MED FILL — PHENTERMINE 37.5 MG TABLET: 37.5 | 30 days supply | Qty: 30 | Fill #0

## 2019-10-25 NOTE — Progress Notes (Signed)
Chief Complaint:   OBESITY Rebecca Mcneil is here to discuss her progress with her obesity treatment plan along with follow-up of her obesity related diagnoses. Rebecca Mcneil is on the Category 4 Plan and states she is following her eating plan approximately 80% of the time. Rebecca Mcneil states she is walking for 30 minutes 2 times per week.  Today's visit was #: 7 Starting weight: 244 lbs Starting date: 07/28/2019 Today's weight: 236 lbs Today's date: 10/25/2019 Total lbs lost to date: 8 lbs Total lbs lost since last in-office visit: 0  Interim History: Rebecca Mcneil says she struggles at night and on weekends.  She has been doing some emotional eating and has increased cravings for sweets.  Subjective:   1. Vitamin D deficiency Rebecca Mcneil's Vitamin D level was 33.0 on 07/28/2019. She is currently taking vit D. She denies nausea, vomiting or muscle weakness.  2. B12 deficiency She is not a vegetarian.  She does not have a previous diagnosis of pernicious anemia.  She does not have a history of weight loss surgery.   Lab Results  Component Value Date   VITAMINB12 293 07/28/2019   3. Insulin resistance Rebecca Mcneil has a diagnosis of insulin resistance based on her elevated fasting insulin level >5. She continues to work on diet and exercise to decrease her risk of diabetes.  Lab Results  Component Value Date   INSULIN 18.4 07/28/2019   Lab Results  Component Value Date   HGBA1C 5.0 07/28/2019   4. Other specified hypothyroidism Rebecca Mcneil is taking levothyroxine 137 mcg daily.   Lab Results  Component Value Date   TSH 0.401 (L) 10/11/2019   5. Other depression, with emotional eating Rebecca Mcneil is struggling with emotional eating and using food for comfort to the extent that it is negatively impacting her health. She has been working on behavior modification techniques to help reduce her emotional eating and has been unsuccessful. She shows no sign of suicidal or homicidal ideations.  6. At risk for  constipation Rebecca Mcneil is at increased risk for constipation due to inadequate water intake, changes in diet, and/or use of medications such as GLP1 agonists. Rebecca Mcneil denies hard, infrequent stools currently.   Assessment/Plan:   1. Vitamin D deficiency Low Vitamin D level contributes to fatigue and are associated with obesity, breast, and colon cancer. She agrees to continue to take prescription Vitamin D @50 ,000 IU every week and will follow-up for routine testing of Vitamin D, at least 2-3 times per year to avoid over-replacement.  2. B12 deficiency The diagnosis was reviewed with the patient. Counseling provided today, see below. We will continue to monitor. Orders and follow up as documented in patient record.  Counseling . The body needs vitamin B12: to make red blood cells; to make DNA; and to help the nerves work properly so they can carry messages from the brain to the body.  . The main causes of vitamin B12 deficiency include dietary deficiency, digestive diseases, pernicious anemia, and having a surgery in which part of the stomach or small intestine is removed.  . Certain medicines can make it harder for the body to absorb vitamin B12. These medicines include: heartburn medications; some antibiotics; some medications used to treat diabetes, gout, and high cholesterol.  . In some cases, there are no symptoms of this condition. If the condition leads to anemia or nerve damage, various symptoms can occur, such as weakness or fatigue, shortness of breath, and numbness or tingling in your hands and feet.   Marland Kitchen  Treatment:  o May include taking vitamin B12 supplements.  o Avoid alcohol.  o Eat lots of healthy foods that contain vitamin B12: - Beef, pork, chicken, [place], and organ meats, such as liver.  - Seafood: This includes clams, rainbow trout, salmon, tuna, and haddock. Eggs.  - Cereal and dairy products that are fortified: This means that vitamin B12 has been added to the food.   3.  Insulin resistance Krystianna will continue to work on weight loss, exercise, and decreasing simple carbohydrates to help decrease the risk of diabetes. Fredericka agreed to follow-up with [place] as directed to closely monitor her progress.  Orders - metFORMIN (GLUCOPHAGE) 500 MG tablet; Take 1 tablet (500 mg total) by mouth daily at 2 PM.  Dispense: 30 tablet; Refill: 0  4. Other specified hypothyroidism Patient with long-standing hypothyroidism, on levothyroxine therapy. She appears euthyroid. Orders and follow up as documented in patient record.  Counseling . Good thyroid control is important for overall health. Supratherapeutic thyroid levels are dangerous and will not improve weight loss results. . The correct way to take levothyroxine is fasting, with water, separated by at least 30 minutes from breakfast, and separated by more than 4 hours from calcium, iron, multivitamins, acid reflux medications (PPIs).   5. Other depression, with emotional eating Behavior modification techniques were discussed today to help Rebecca Mcneil deal with her emotional/non-hunger eating behaviors.  Orders and follow up as documented in patient record.   Orders - metFORMIN (GLUCOPHAGE) 500 MG tablet; Take 1 tablet (500 mg total) by mouth daily at 2 PM.  Dispense: 30 tablet; Refill: 0 - phentermine (ADIPEX-P) 37.5 MG tablet; Take 1 tablet (37.5 mg total) by mouth daily before breakfast.  Dispense: 30 tablet; Refill: 0  6. At risk for constipation Rebecca Mcneil was given approximately 15 minutes of counseling today regarding prevention of constipation. She was encouraged to increase water and fiber intake.   7. Class 2 severe obesity with serious comorbidity and body mass index (BMI) of 38.0 to 38.9 in adult, unspecified obesity type (HCC) Rebecca Mcneil is currently in the action stage of change. As such, her goal is to continue with weight loss efforts. She has agreed to the Category 4 Plan.   Exercise goals: For substantial health  benefits, adults should do at least 150 minutes (2 hours and 30 minutes) a week of moderate-intensity, or 75 minutes (1 hour and 15 minutes) a week of vigorous-intensity aerobic physical activity, or an equivalent combination of moderate- and vigorous-intensity aerobic activity. Aerobic activity should be performed in episodes of at least 10 minutes, and preferably, it should be spread throughout the week. Adults should also include muscle-strengthening activities that involve all major muscle groups on 2 or more days a week.  Behavioral modification strategies: increasing lean protein intake and increasing water intake.  Rebecca Mcneil has agreed to follow-up with our clinic in 2 weeks. She was informed of the importance of frequent follow-up visits to maximize her success with intensive lifestyle modifications for her multiple health conditions.   Objective:   Blood pressure 116/75, pulse 68, temperature 98.3 F (36.8 C), temperature source Oral, height 5' 6"  (1.676 m), weight 236 lb (107 kg), last menstrual period 09/27/2019, SpO2 97 %. Body mass index is 38.09 kg/m.  General: Cooperative, alert, well developed, in no acute distress. HEENT: Conjunctivae and lids unremarkable. Cardiovascular: Regular rhythm.  Lungs: Normal work of breathing. Neurologic: No focal deficits.   Lab Results  Component Value Date   CREATININE 0.74 07/28/2019   BUN  15 07/28/2019   NA 142 07/28/2019   K 4.4 07/28/2019   CL 107 (H) 07/28/2019   CO2 23 07/28/2019   Lab Results  Component Value Date   ALT 21 07/28/2019   AST 18 07/28/2019   ALKPHOS 101 07/28/2019   BILITOT 0.3 07/28/2019   Lab Results  Component Value Date   HGBA1C 5.0 07/28/2019   Lab Results  Component Value Date   INSULIN 18.4 07/28/2019   Lab Results  Component Value Date   TSH 0.401 (L) 10/11/2019   Lab Results  Component Value Date   CHOL 174 07/28/2019   HDL 50 07/28/2019   LDLCALC 105 (H) 07/28/2019   TRIG 104 07/28/2019    Lab Results  Component Value Date   WBC 7.2 07/28/2019   HGB 13.3 07/28/2019   HCT 40.6 07/28/2019   MCV 86 07/28/2019   PLT 338 07/28/2019   Attestation Statements:   Reviewed by clinician on day of visit: allergies, medications, problem list, medical history, surgical history, family history, social history, and previous encounter notes.  I, Water quality scientist, CMA, am acting as Location manager for [employer], DO.  I have reviewed the above documentation for accuracy and completeness, and I agree with the above. Briscoe Deutscher, DO

## 2019-10-26 ENCOUNTER — Encounter (INDEPENDENT_AMBULATORY_CARE_PROVIDER_SITE_OTHER): Payer: Self-pay | Admitting: Family Medicine

## 2019-11-10 ENCOUNTER — Other Ambulatory Visit: Payer: Self-pay

## 2019-11-10 ENCOUNTER — Encounter (INDEPENDENT_AMBULATORY_CARE_PROVIDER_SITE_OTHER): Payer: Self-pay | Admitting: Family Medicine

## 2019-11-10 ENCOUNTER — Ambulatory Visit (INDEPENDENT_AMBULATORY_CARE_PROVIDER_SITE_OTHER): Payer: 59 | Admitting: Family Medicine

## 2019-11-10 VITALS — BP 121/83 | HR 75 | Temp 97.7°F | Ht 66.0 in | Wt 237.0 lb

## 2019-11-10 DIAGNOSIS — E559 Vitamin D deficiency, unspecified: Secondary | ICD-10-CM

## 2019-11-10 DIAGNOSIS — F3289 Other specified depressive episodes: Secondary | ICD-10-CM

## 2019-11-10 DIAGNOSIS — E8881 Metabolic syndrome: Secondary | ICD-10-CM

## 2019-11-10 DIAGNOSIS — Z9189 Other specified personal risk factors, not elsewhere classified: Secondary | ICD-10-CM | POA: Diagnosis not present

## 2019-11-10 DIAGNOSIS — Z6838 Body mass index (BMI) 38.0-38.9, adult: Secondary | ICD-10-CM

## 2019-11-10 DIAGNOSIS — E039 Hypothyroidism, unspecified: Secondary | ICD-10-CM | POA: Diagnosis not present

## 2019-11-10 DIAGNOSIS — E538 Deficiency of other specified B group vitamins: Secondary | ICD-10-CM | POA: Diagnosis not present

## 2019-11-10 MED ORDER — VITAMIN D (ERGOCALCIFEROL) 1.25 MG (50000 UNIT) PO CAPS: 50000.0000 [IU] | ORAL_CAPSULE | ORAL | 0 refills | Status: DC

## 2019-11-10 MED ORDER — LEVOTHYROXINE SODIUM 137 MCG PO TABS: 137.0000 ug | ORAL_TABLET | Freq: Every day | ORAL | 0 refills | Status: DC

## 2019-11-10 MED FILL — LEVOTHYROXINE 137 MCG TAB: 137 | 30 days supply | Qty: 30 | Fill #0

## 2019-11-10 MED FILL — VIT D2 1.25 MG (50,000 UNIT: 1.25 MG | 28 days supply | Qty: 4 | Fill #0

## 2019-11-10 NOTE — Progress Notes (Signed)
Chief Complaint:   OBESITY Rebecca Mcneil is here to discuss her progress with her obesity treatment plan along with follow-up of her obesity related diagnoses. [name] is on the Category 4 Plan and states she is following her eating plan approximately 50% of the time. [name] states she is walking for 15-30 minutes 3-4 times per week.  Today's visit was #: 8 Starting weight: 244 lbs Starting date: 07/28/2019 Today's weight: 237 lbs Today's date: 11/10/2019 Total lbs lost to date: 7 lbs Total lbs lost since last in-office visit: 0  Interim History: [name] says she has been busy on the weekends lately, so she has not needed to take the 1/2 tablet of phentermine.  She will be going to [place] next week.  Subjective:   1. Vitamin D deficiency Rebecca Mcneil's Vitamin D level was 33.0 on 07/28/2019. She is currently taking vit D. She denies nausea, vomiting or muscle weakness.  2. B12 deficiency She is not a vegetarian.  She does not have a previous diagnosis of pernicious anemia.  She does not have a history of weight loss surgery.   Lab Results  Component Value Date   VITAMINB12 293 07/28/2019   3. Insulin resistance Rebecca Mcneil has a diagnosis of insulin resistance based on her elevated fasting insulin level >5. She continues to work on diet and exercise to decrease her risk of diabetes.  She is taking metformin 500 mg daily.  Lab Results  Component Value Date   INSULIN 18.4 07/28/2019   Lab Results  Component Value Date   HGBA1C 5.0 07/28/2019   4. Hypothyroidism, unspecified type Rebecca Mcneil is taking levothyroxine 137 mcg daily.  Lab Results  Component Value Date   TSH 0.401 (L) 10/11/2019   5. Other depression, with emotional eating Rebecca Mcneil is struggling with emotional eating and using food for comfort to the extent that it is negatively impacting her health. She has been working on behavior modification techniques to help reduce her emotional eating and has been unsuccessful. She shows no  sign of suicidal or homicidal ideations.  She has phentermine to take as needed, but has not taken it yet.  6. At risk for diabetes mellitus Rebecca Mcneil is at higher than average risk for developing diabetes due to her obesity.   Assessment/Plan:   1. Vitamin D deficiency Low Vitamin D level contributes to fatigue and are associated with obesity, breast, and colon cancer. She agrees to continue to take prescription Vitamin D @50 ,000 IU every week and will follow-up for routine testing of Vitamin D, at least 2-3 times per year to avoid over-replacement.  Orders - Vitamin D, Ergocalciferol, (DRISDOL) 1.25 MG (50000 UNIT) CAPS capsule; Take 1 capsule (50,000 Units total) by mouth every 7 (seven) days.  Dispense: 4 capsule; Refill: 0  2. B12 deficiency The diagnosis was reviewed with the patient. Counseling provided today, see below. We will continue to monitor. Orders and follow up as documented in patient record.  Counseling . The body needs vitamin B12: to make red blood cells; to make DNA; and to help the nerves work properly so they can carry messages from the brain to the body.  . The main causes of vitamin B12 deficiency include dietary deficiency, digestive diseases, pernicious anemia, and having a surgery in which part of the stomach or small intestine is removed.  . Certain medicines can make it harder for the body to absorb vitamin B12. These medicines include: heartburn medications; some antibiotics; some medications used to treat diabetes, gout, and high  cholesterol.  . In some cases, there are no symptoms of this condition. If the condition leads to anemia or nerve damage, various symptoms can occur, such as weakness or fatigue, shortness of breath, and numbness or tingling in your hands and feet.   . Treatment:  o May include taking vitamin B12 supplements.  o Avoid alcohol.  o Eat lots of healthy foods that contain vitamin B12: - Beef, pork, chicken, [place], and organ meats, such as  liver.  - Seafood: This includes clams, rainbow trout, salmon, tuna, and haddock. Eggs.  - Cereal and dairy products that are fortified: This means that vitamin B12 has been added to the food.   3. Insulin resistance Rebecca Mcneil will continue to work on weight loss, exercise, and decreasing simple carbohydrates to help decrease the risk of diabetes. Rebecca Mcneil agreed to follow-up with [place] as directed to closely monitor her progress.  4. Hypothyroidism, unspecified type Patient with long-standing hypothyroidism, on levothyroxine therapy. She appears euthyroid. Orders and follow up as documented in patient record.  Counseling . Good thyroid control is important for overall health. Supratherapeutic thyroid levels are dangerous and will not improve weight loss results. . The correct way to take levothyroxine is fasting, with water, separated by at least 30 minutes from breakfast, and separated by more than 4 hours from calcium, iron, multivitamins, acid reflux medications (PPIs).   Orders - levothyroxine (SYNTHROID) 137 MCG tablet; Take 1 tablet (137 mcg total) by mouth daily before breakfast.  Dispense: 30 tablet; Refill: 0  5. Other depression, with emotional eating Behavior modification techniques were discussed today to help Rebecca Mcneil deal with her emotional/non-hunger eating behaviors.  Orders and follow up as documented in patient record.   6. At risk for diabetes mellitus Rebecca Mcneil was given approximately 15 minutes of diabetes education and counseling today. We discussed intensive lifestyle modifications today with an emphasis on weight loss as well as increasing exercise and decreasing simple carbohydrates in her diet. We also reviewed medication options with an emphasis on risk versus benefit of those discussed.   Repetitive spaced learning was employed today to elicit superior memory formation and behavioral change.  7. Class 2 severe obesity with serious comorbidity and body mass index (BMI) of 38.0  to 38.9 in adult, unspecified obesity type (HCC) Rebecca Mcneil is currently in the action stage of change. As such, her goal is to continue with weight loss efforts. She has agreed to keeping a food journal and adhering to recommended goals of 1800-2000 calories and 95-125 grams of protein.   Exercise goals: For substantial health benefits, adults should do at least 150 minutes (2 hours and 30 minutes) a week of moderate-intensity, or 75 minutes (1 hour and 15 minutes) a week of vigorous-intensity aerobic physical activity, or an equivalent combination of moderate- and vigorous-intensity aerobic activity. Aerobic activity should be performed in episodes of at least 10 minutes, and preferably, it should be spread throughout the week.  Behavioral modification strategies: increasing lean protein intake and meal planning and cooking strategies.  Beyonce has agreed to follow-up with our clinic in 2 weeks. She was informed of the importance of frequent follow-up visits to maximize her success with intensive lifestyle modifications for her multiple health conditions.   Objective:   Blood pressure 121/83, pulse 75, temperature 97.7 F (36.5 C), temperature source Oral, height 5' 6"  (1.676 m), weight 237 lb (107.5 kg), last menstrual period 10/25/2019, SpO2 99 %. Body mass index is 38.25 kg/m.  General: Cooperative, alert, well developed, in  no acute distress. HEENT: Conjunctivae and lids unremarkable. Cardiovascular: Regular rhythm.  Lungs: Normal work of breathing. Neurologic: No focal deficits.   Lab Results  Component Value Date   CREATININE 0.74 07/28/2019   BUN 15 07/28/2019   NA 142 07/28/2019   K 4.4 07/28/2019   CL 107 (H) 07/28/2019   CO2 23 07/28/2019   Lab Results  Component Value Date   ALT 21 07/28/2019   AST 18 07/28/2019   ALKPHOS 101 07/28/2019   BILITOT 0.3 07/28/2019   Lab Results  Component Value Date   HGBA1C 5.0 07/28/2019   Lab Results  Component Value Date   INSULIN  18.4 07/28/2019   Lab Results  Component Value Date   TSH 0.401 (L) 10/11/2019   Lab Results  Component Value Date   CHOL 174 07/28/2019   HDL 50 07/28/2019   LDLCALC 105 (H) 07/28/2019   TRIG 104 07/28/2019   Lab Results  Component Value Date   WBC 7.2 07/28/2019   HGB 13.3 07/28/2019   HCT 40.6 07/28/2019   MCV 86 07/28/2019   PLT 338 07/28/2019   Attestation Statements:   Reviewed by clinician on day of visit: allergies, medications, problem list, medical history, surgical history, family history, social history, and previous encounter notes.  I, Water quality scientist, CMA, am acting as Location manager for [employer], DO.  I have reviewed the above documentation for accuracy and completeness, and I agree with the above. Briscoe Deutscher, DO

## 2019-12-01 ENCOUNTER — Ambulatory Visit (INDEPENDENT_AMBULATORY_CARE_PROVIDER_SITE_OTHER): Payer: 59 | Admitting: Family Medicine

## 2019-12-01 ENCOUNTER — Other Ambulatory Visit: Payer: Self-pay

## 2019-12-01 ENCOUNTER — Encounter (INDEPENDENT_AMBULATORY_CARE_PROVIDER_SITE_OTHER): Payer: Self-pay | Admitting: Family Medicine

## 2019-12-01 VITALS — BP 114/78 | HR 88 | Temp 98.3°F | Ht 66.0 in | Wt 234.0 lb

## 2019-12-01 DIAGNOSIS — Z6837 Body mass index (BMI) 37.0-37.9, adult: Secondary | ICD-10-CM | POA: Diagnosis not present

## 2019-12-01 DIAGNOSIS — Z9189 Other specified personal risk factors, not elsewhere classified: Secondary | ICD-10-CM

## 2019-12-01 DIAGNOSIS — E039 Hypothyroidism, unspecified: Secondary | ICD-10-CM | POA: Diagnosis not present

## 2019-12-01 DIAGNOSIS — E559 Vitamin D deficiency, unspecified: Secondary | ICD-10-CM | POA: Diagnosis not present

## 2019-12-01 DIAGNOSIS — F3289 Other specified depressive episodes: Secondary | ICD-10-CM

## 2019-12-01 DIAGNOSIS — E8881 Metabolic syndrome: Secondary | ICD-10-CM

## 2019-12-01 MED ORDER — METFORMIN HCL 500 MG PO TABS: 500.0000 mg | ORAL_TABLET | Freq: Every day | ORAL | 0 refills | Status: DC

## 2019-12-01 MED ORDER — VITAMIN D (ERGOCALCIFEROL) 1.25 MG (50000 UNIT) PO CAPS: 50000.0000 [IU] | ORAL_CAPSULE | ORAL | 0 refills | Status: DC

## 2019-12-01 MED ORDER — LEVOTHYROXINE SODIUM 137 MCG PO TABS: 137.0000 ug | ORAL_TABLET | Freq: Every day | ORAL | 0 refills | Status: DC

## 2019-12-01 MED FILL — metFORMIN HCL 500 MG TABS: 500 | 30 days supply | Qty: 30 | Fill #0

## 2019-12-01 NOTE — Progress Notes (Signed)
Chief Complaint:   OBESITY Rebecca Mcneil is here to discuss her progress with her obesity treatment plan along with follow-up of her obesity related diagnoses. Rebecca Mcneil is on keeping a food journal and adhering to recommended goals of 1800-2000 calories and 95-125 grams of protein and states she is following her eating plan approximately 75% of the time. Rebecca Mcneil states she is walking for 25 minutes 3 times per week.  Today's visit was #: 9 Starting weight: 244 lbs Starting date: 07/28/2019 Today's weight: 234 lbs Today's date: 12/01/2019 Total lbs lost to date: 10 lbs Total lbs lost since last in-office visit: 3 lbs  Interim History: Kerisha says she had a great time at [employer].  She bought a My [employer]itness Pal upgrade and has an office fridge coming tomorrow.  She says she still struggles to get all her calories and protein in daily.  Subjective:   1. Insulin resistance Rebecca Mcneil has a diagnosis of insulin resistance based on her elevated fasting insulin level >5. She continues to work on diet and exercise to decrease her risk of diabetes.  She is taking metformin 500 mg daily.  Lab Results  Component Value Date   INSULIN 18.4 07/28/2019   Lab Results  Component Value Date   HGBA1C 5.0 07/28/2019   2. Hypothyroidism, unspecified type Rebecca Mcneil takes levothyroxine 137 mcg daily.   Lab Results  Component Value Date   TSH 0.401 (L) 10/11/2019   3. Vitamin D deficiency Rebecca Mcneil's Vitamin D level was 33.0 on 07/28/2019. She is currently taking vit D. She denies nausea, vomiting or muscle weakness.  4. Other depression, with emotional eating Rebecca Mcneil is struggling with emotional eating and using food for comfort to the extent that it is negatively impacting her health. She has been working on behavior modification techniques to help reduce her emotional eating and has been unsuccessful. She shows no sign of suicidal or homicidal ideations.   Assessment/Plan:   1. Insulin resistance Rebecca Mcneil will  continue to work on weight loss, exercise, and decreasing simple carbohydrates to help decrease the risk of diabetes. Rebecca Mcneil agreed to follow-up with [place] as directed to closely monitor her progress.  Orders - metFORMIN (GLUCOPHAGE) 500 MG tablet; Take 1 tablet (500 mg total) by mouth daily at 2 PM.  Dispense: 30 tablet; Refill: 0  2. Hypothyroidism Patient with long-standing hypothyroidism, on levothyroxine therapy. She appears euthyroid. Orders and follow up as documented in patient record.  Counseling . Good thyroid control is important for overall health. Supratherapeutic thyroid levels are dangerous and will not improve weight loss results. . The correct way to take levothyroxine is fasting, with water, separated by at least 30 minutes from breakfast, and separated by more than 4 hours from calcium, iron, multivitamins, acid reflux medications (PPIs).   Orders - levothyroxine (SYNTHROID) 137 MCG tablet; Take 1 tablet (137 mcg total) by mouth daily before breakfast.  Dispense: 30 tablet; Refill: 0  3. Vitamin D deficiency Low Vitamin D level contributes to fatigue and are associated with obesity, breast, and colon cancer. She agrees to continue to take prescription Vitamin D @50 ,000 IU every week and will follow-up for routine testing of Vitamin D, at least 2-3 times per year to avoid over-replacement.  Orders - Vitamin D, Ergocalciferol, (DRISDOL) 1.25 MG (50000 UNIT) CAPS capsule; Take 1 capsule (50,000 Units total) by mouth every 7 (seven) days.  Dispense: 4 capsule; Refill: 0  4. Other depression, with emotional eating Behavior modification techniques were discussed today to help Rebecca Mcneil  deal with her emotional/non-hunger eating behaviors.  Orders and follow up as documented in patient record.   5. At risk for deficient intake of food Rebecca Mcneil was given approximately 15 minutes of deficit intake of food prevention counseling today. Rebecca Mcneil is at risk for eating too few calories based on  current food recall. She was encouraged to focus on meeting caloric and protein goals according to her recommended meal plan.   6. Class 2 severe obesity with serious comorbidity and body mass index (BMI) of 37.0 to 37.9 in adult, unspecified obesity type (HCC) Rebecca Mcneil is currently in the action stage of change. As such, her goal is to continue with weight loss efforts. She has agreed to keeping a food journal and adhering to recommended goals of 1800-2000 calories and 95-125 grams of protein.   Exercise goals: As is.  Behavioral modification strategies: increasing lean protein intake and increasing water intake.  Rebecca Mcneil has agreed to follow-up with our clinic in 2 weeks. She was informed of the importance of frequent follow-up visits to maximize her success with intensive lifestyle modifications for her multiple health conditions.   Objective:   Blood pressure 114/78, pulse 88, temperature 98.3 F (36.8 C), temperature source Oral, height 5' 6"  (1.676 m), weight 234 lb (106.1 kg), last menstrual period 11/21/2019, SpO2 98 %. Body mass index is 37.77 kg/m.  General: Cooperative, alert, well developed, in no acute distress. HEENT: Conjunctivae and lids unremarkable. Cardiovascular: Regular rhythm.  Lungs: Normal work of breathing. Neurologic: No focal deficits.   Lab Results  Component Value Date   CREATININE 0.74 07/28/2019   BUN 15 07/28/2019   NA 142 07/28/2019   K 4.4 07/28/2019   CL 107 (H) 07/28/2019   CO2 23 07/28/2019   Lab Results  Component Value Date   ALT 21 07/28/2019   AST 18 07/28/2019   ALKPHOS 101 07/28/2019   BILITOT 0.3 07/28/2019   Lab Results  Component Value Date   HGBA1C 5.0 07/28/2019   Lab Results  Component Value Date   INSULIN 18.4 07/28/2019   Lab Results  Component Value Date   TSH 0.401 (L) 10/11/2019   Lab Results  Component Value Date   CHOL 174 07/28/2019   HDL 50 07/28/2019   LDLCALC 105 (H) 07/28/2019   TRIG 104 07/28/2019    Lab Results  Component Value Date   WBC 7.2 07/28/2019   HGB 13.3 07/28/2019   HCT 40.6 07/28/2019   MCV 86 07/28/2019   PLT 338 07/28/2019   Attestation Statements:   Reviewed by clinician on day of visit: allergies, medications, problem list, medical history, surgical history, family history, social history, and previous encounter notes.  I, Water quality scientist, CMA, am acting as Location manager for [employer], DO.  I have reviewed the above documentation for accuracy and completeness, and I agree with the above. Briscoe Deutscher, DO

## 2019-12-02 MED FILL — VIT D2 1.25 MG (50,000 UNIT: 1.25 MG | 28 days supply | Qty: 4 | Fill #0

## 2019-12-06 MED FILL — LEVOTHYROXINE 137 MCG TAB: 137 | 30 days supply | Qty: 30 | Fill #0

## 2019-12-21 ENCOUNTER — Ambulatory Visit (INDEPENDENT_AMBULATORY_CARE_PROVIDER_SITE_OTHER): Payer: 59 | Admitting: Family Medicine

## 2019-12-21 ENCOUNTER — Other Ambulatory Visit: Payer: Self-pay

## 2019-12-21 ENCOUNTER — Encounter (INDEPENDENT_AMBULATORY_CARE_PROVIDER_SITE_OTHER): Payer: Self-pay | Admitting: Family Medicine

## 2019-12-21 VITALS — BP 112/74 | HR 72 | Temp 98.1°F | Ht 66.0 in | Wt 237.0 lb

## 2019-12-21 DIAGNOSIS — Z6838 Body mass index (BMI) 38.0-38.9, adult: Secondary | ICD-10-CM | POA: Diagnosis not present

## 2019-12-21 DIAGNOSIS — Z9189 Other specified personal risk factors, not elsewhere classified: Secondary | ICD-10-CM | POA: Diagnosis not present

## 2019-12-21 DIAGNOSIS — E559 Vitamin D deficiency, unspecified: Secondary | ICD-10-CM | POA: Diagnosis not present

## 2019-12-21 DIAGNOSIS — E039 Hypothyroidism, unspecified: Secondary | ICD-10-CM | POA: Diagnosis not present

## 2019-12-21 DIAGNOSIS — E8881 Metabolic syndrome: Secondary | ICD-10-CM | POA: Diagnosis not present

## 2019-12-21 NOTE — Progress Notes (Signed)
Chief Complaint:   OBESITY Callie is here to discuss her progress with her obesity treatment plan along with follow-up of her obesity related diagnoses. Bostyn is on keeping a food journal and adhering to recommended goals of 1800-2000 calories and 95-125 grams of protein and states she is following her eating plan approximately 50% of the time. Guy states she is walking for 30-45 minutes 1-2 times per week.  Today's visit was #: 10 Starting weight: 244 lbs Starting date: 07/28/2019 Today's weight: 237 lbs Today's date: 12/21/2019 Total lbs lost to date: 7 lbs Total lbs lost since last in-office visit: 0  Interim History: Sirenia was off plan for 1 week while she was on her period.  Didn't eat.  She did celebration eating for Easter.  Subjective:   1. Vitamin D deficiency Nikitia's Vitamin D level was 33.0 on 07/28/2019. She is currently taking prescription vitamin D 50,000 IU each week. She denies nausea, vomiting or muscle weakness.  2. Insulin resistance Shellyann has a diagnosis of insulin resistance based on her elevated fasting insulin level >5. She continues to work on diet and exercise to decrease her risk of diabetes.  Lab Results  Component Value Date   INSULIN 18.4 07/28/2019   Lab Results  Component Value Date   HGBA1C 5.0 07/28/2019   3. Hypothyroidism, unspecified type Dove is taking levothyroxine 137 mcg daily.  Lab Results  Component Value Date   TSH 0.401 (L) 10/11/2019   Assessment/Plan:   1. Vitamin D deficiency Low Vitamin D level contributes to fatigue and are associated with obesity, breast, and colon cancer. She agrees to continue to take prescription Vitamin D @50 ,000 IU every week and will follow-up for routine testing of Vitamin D, at least 2-3 times per year to avoid over-replacement.  2. Insulin resistance Courtenay will continue to work on weight loss, exercise, and decreasing simple carbohydrates to help decrease the risk of diabetes.  Vasiliki agreed to follow-up with [place] as directed to closely monitor her progress.  3. Hypothyroidism Patient with long-standing hypothyroidism, on levothyroxine therapy. She appears euthyroid. Orders and follow up as documented in patient record.  Counseling . Good thyroid control is important for overall health. Supratherapeutic thyroid levels are dangerous and will not improve weight loss results. . The correct way to take levothyroxine is fasting, with water, separated by at least 30 minutes from breakfast, and separated by more than 4 hours from calcium, iron, multivitamins, acid reflux medications (PPIs).   4. At risk for deficient intake of food Cassandr was given approximately 15 minutes of deficit intake of food prevention counseling today. Hollee is at risk for eating too few calories based on current food recall. She was encouraged to focus on meeting caloric and protein goals according to her recommended meal plan.   5. Class 2 severe obesity with serious comorbidity and body mass index (BMI) of 38.0 to 38.9 in adult, unspecified obesity type (HCC) Rhina is currently in the action stage of change. As such, her goal is to continue with weight loss efforts. She has agreed to keeping a food journal and adhering to recommended goals of 1800-2000 calories and 95-125 grams of protein.   Exercise goals: As is.  Behavioral modification strategies: increasing lean protein intake, increasing water intake and meal planning and cooking strategies.  Tocara has agreed to follow-up with our clinic in 2 weeks. She was informed of the importance of frequent follow-up visits to maximize her success with intensive lifestyle modifications for  her multiple health conditions.   Objective:   Blood pressure 112/74, pulse 72, temperature 98.1 F (36.7 C), temperature source Oral, height 5' 6"  (1.676 m), weight 237 lb (107.5 kg), last menstrual period 12/12/2019, SpO2 99 %. Body mass index is 38.25  kg/m.  General: Cooperative, alert, well developed, in no acute distress. HEENT: Conjunctivae and lids unremarkable. Cardiovascular: Regular rhythm.  Lungs: Normal work of breathing. Neurologic: No focal deficits.   Lab Results  Component Value Date   CREATININE 0.74 07/28/2019   BUN 15 07/28/2019   NA 142 07/28/2019   K 4.4 07/28/2019   CL 107 (H) 07/28/2019   CO2 23 07/28/2019   Lab Results  Component Value Date   ALT 21 07/28/2019   AST 18 07/28/2019   ALKPHOS 101 07/28/2019   BILITOT 0.3 07/28/2019   Lab Results  Component Value Date   HGBA1C 5.0 07/28/2019   Lab Results  Component Value Date   INSULIN 18.4 07/28/2019   Lab Results  Component Value Date   TSH 0.401 (L) 10/11/2019   Lab Results  Component Value Date   CHOL 174 07/28/2019   HDL 50 07/28/2019   LDLCALC 105 (H) 07/28/2019   TRIG 104 07/28/2019   Lab Results  Component Value Date   WBC 7.2 07/28/2019   HGB 13.3 07/28/2019   HCT 40.6 07/28/2019   MCV 86 07/28/2019   PLT 338 07/28/2019   Attestation Statements:   Reviewed by clinician on day of visit: allergies, medications, problem list, medical history, surgical history, family history, social history, and previous encounter notes.  I, Water quality scientist, CMA, am acting as Location manager for [employer], DO.  I have reviewed the above documentation for accuracy and completeness, and I agree with the above. Briscoe Deutscher, DO

## 2019-12-22 LAB — CBC WITH DIFFERENTIAL/PLATELET
Basophils Absolute: 0 10*3/uL (ref 0.0–0.2)
Basos: 0 %
EOS (ABSOLUTE): 0.1 10*3/uL (ref 0.0–0.4)
Eos: 1 %
Hematocrit: 45.4 % (ref 34.0–46.6)
Hemoglobin: 14.9 g/dL (ref 11.1–15.9)
Immature Grans (Abs): 0 10*3/uL (ref 0.0–0.1)
Immature Granulocytes: 0 %
Lymphocytes Absolute: 3.2 10*3/uL — ABNORMAL HIGH (ref 0.7–3.1)
Lymphs: 40 %
MCH: 28.1 pg (ref 26.6–33.0)
MCHC: 32.8 g/dL (ref 31.5–35.7)
MCV: 86 fL (ref 79–97)
Monocytes Absolute: 0.5 10*3/uL (ref 0.1–0.9)
Monocytes: 6 %
Neutrophils Absolute: 4.2 10*3/uL (ref 1.4–7.0)
Neutrophils: 53 %
Platelets: 417 10*3/uL (ref 150–450)
RBC: 5.31 x10E6/uL — ABNORMAL HIGH (ref 3.77–5.28)
RDW: 12.4 % (ref 11.7–15.4)
WBC: 8.1 10*3/uL (ref 3.4–10.8)

## 2019-12-22 LAB — VITAMIN D 25 HYDROXY (VIT D DEFICIENCY, FRACTURES): Vit D, 25-Hydroxy: 37.9 ng/mL (ref 30.0–100.0)

## 2019-12-22 LAB — COMPREHENSIVE METABOLIC PANEL
ALT: 15 IU/L (ref 0–32)
AST: 14 IU/L (ref 0–40)
Albumin/Globulin Ratio: 1.7 (ref 1.2–2.2)
Albumin: 4.4 g/dL (ref 3.8–4.8)
Alkaline Phosphatase: 122 IU/L — ABNORMAL HIGH (ref 39–117)
BUN/Creatinine Ratio: 21 (ref 9–23)
BUN: 13 mg/dL (ref 6–20)
Bilirubin Total: 0.3 mg/dL (ref 0.0–1.2)
CO2: 20 mmol/L (ref 20–29)
Calcium: 9.5 mg/dL (ref 8.7–10.2)
Chloride: 108 mmol/L — ABNORMAL HIGH (ref 96–106)
Creatinine, Ser: 0.62 mg/dL (ref 0.57–1.00)
GFR calc Af Amer: 138 mL/min/{1.73_m2} (ref >59)
GFR calc non Af Amer: 120 mL/min/{1.73_m2} (ref >59)
Globulin, Total: 2.6 g/dL (ref 1.5–4.5)
Glucose: 94 mg/dL (ref 65–99)
Potassium: 4.5 mmol/L (ref 3.5–5.2)
Sodium: 143 mmol/L (ref 134–144)
Total Protein: 7 g/dL (ref 6.0–8.5)

## 2019-12-22 LAB — VITAMIN B12: Vitamin B-12: 314 pg/mL (ref 232–1245)

## 2019-12-22 LAB — INSULIN, RANDOM: INSULIN: 19.7 u[IU]/mL (ref 2.6–24.9)

## 2020-01-06 ENCOUNTER — Ambulatory Visit (INDEPENDENT_AMBULATORY_CARE_PROVIDER_SITE_OTHER): Payer: 59 | Admitting: Family Medicine

## 2020-01-10 ENCOUNTER — Encounter (INDEPENDENT_AMBULATORY_CARE_PROVIDER_SITE_OTHER): Payer: Self-pay | Admitting: Family Medicine

## 2020-01-10 ENCOUNTER — Other Ambulatory Visit: Payer: Self-pay

## 2020-01-10 ENCOUNTER — Ambulatory Visit (INDEPENDENT_AMBULATORY_CARE_PROVIDER_SITE_OTHER): Payer: 59 | Admitting: Family Medicine

## 2020-01-10 VITALS — BP 109/76 | HR 80 | Temp 98.0°F | Ht 66.0 in | Wt 239.0 lb

## 2020-01-10 DIAGNOSIS — E8881 Metabolic syndrome: Secondary | ICD-10-CM

## 2020-01-10 DIAGNOSIS — E039 Hypothyroidism, unspecified: Secondary | ICD-10-CM | POA: Diagnosis not present

## 2020-01-10 DIAGNOSIS — E559 Vitamin D deficiency, unspecified: Secondary | ICD-10-CM | POA: Diagnosis not present

## 2020-01-10 DIAGNOSIS — Z6838 Body mass index (BMI) 38.0-38.9, adult: Secondary | ICD-10-CM

## 2020-01-10 MED ORDER — VITAMIN D (ERGOCALCIFEROL) 1.25 MG (50000 UNIT) PO CAPS: 50000.0000 [IU] | ORAL_CAPSULE | ORAL | 0 refills | Status: DC

## 2020-01-10 MED ORDER — METFORMIN HCL 500 MG PO TABS: 500.0000 mg | ORAL_TABLET | Freq: Every day | ORAL | 0 refills | Status: DC

## 2020-01-10 MED ORDER — LEVOTHYROXINE SODIUM 137 MCG PO TABS: 137.0000 ug | ORAL_TABLET | Freq: Every day | ORAL | 0 refills | Status: DC

## 2020-01-10 MED FILL — LEVOTHYROXINE 137 MCG TAB: 137 | 30 days supply | Qty: 30 | Fill #0

## 2020-01-10 MED FILL — metFORMIN HCL 500 MG TABS: 500 | 30 days supply | Qty: 30 | Fill #0

## 2020-01-10 MED FILL — VIT D2 1.25 MG (50,000 UNIT: 1.25 MG | 28 days supply | Qty: 4 | Fill #0

## 2020-01-11 NOTE — Progress Notes (Signed)
Chief Complaint:   OBESITY Rebecca Mcneil is here to discuss her progress with her obesity treatment plan along with follow-up of her obesity related diagnoses. [name] is on keeping a food journal and adhering to recommended goals of 1800-2000 calories and 95-125 grams of protein daily and states she is following her eating plan approximately 25% of the time. [name] states she is walking for 30 minutes 2 times per week.  Today's visit was #: 11 Starting weight: 244 lbs Starting date: 07/28/2019 Today's weight: 239 lbs Today's date: 01/10/2020 Total lbs lost to date: 5 Total lbs lost since last in-office visit: 0  Interim History: [name] has not journaled consistently the past few weeks. She often skips lunch. She sometimes supplements with protein shakes. She also does not always have a healthy meal at supper. She sometimes picks up fast food.  Subjective:   1. Insulin resistance [name] often forgets metformin, but she is tolerating it well. She notes polyphagia at night. I discussed labs with the patient today.  2. Vitamin D deficiency [name]'s Vit D level is still low at 37.9 (up from 33). She is on weekly prescription Vit D. She does admits to missing a dose about once a month. I discussed labs with the patient today.  3. Hypothyroidism, unspecified type [name]'s TSH is a bit low at 0.401, was at 5.650. She is on levothyroxine 125 mcg daily. She is taking it with another medication.   Assessment/Plan:   1. Insulin resistance Chandelle will continue to work on weight loss, exercise, and decreasing simple carbohydrates to help decrease the risk of diabetes. We will refill metformin for 1 month, she is to take with supper. Kynslee agreed to follow-up with [place] as directed to closely monitor her progress.  - metFORMIN (GLUCOPHAGE) 500 MG tablet; Take 1 tablet (500 mg total) by mouth daily with supper.  Dispense: 30 tablet; Refill: 0  2. Vitamin D deficiency Low Vitamin D level contributes  to fatigue and are associated with obesity, breast, and colon cancer. We will refill prescription Vitamin D for 1 month. Mayrani will follow-up for routine testing of Vitamin D, at least 2-3 times per year to avoid over-replacement. We discussed that she should increase her compliance.  - Vitamin D, Ergocalciferol, (DRISDOL) 1.25 MG (50000 UNIT) CAPS capsule; Take 1 capsule (50,000 Units total) by mouth every 7 (seven) days.  Dispense: 4 capsule; Refill: 0  3. Hypothyroidism, unspecified type Patient with long-standing hypothyroidism. We will refill levothyroxine for 1 month. [name] appears euthyroid. Orders and follow up as documented in patient record. Advised: The correct way to take levothyroxine is fasting, with water, separated by at least 30 minutes from breakfast, and separated by more than 4 hours from calcium, iron, multivitamins, acid reflux medications (PPIs).   - levothyroxine (SYNTHROID) 137 MCG tablet; Take 1 tablet (137 mcg total) by mouth daily before breakfast.  Dispense: 30 tablet; Refill: 0  4. Class 2 severe obesity with serious comorbidity and body mass index (BMI) of 38.0 to 38.9 in adult, unspecified obesity type (HCC) [name] is currently in the action stage of change. As such, her goal is to continue with weight loss efforts. She has agreed to keeping a food journal and adhering to recommended goals of 1800-2000 calories and 95-125 grams of protein daily.   Handout given today: Eating Out. We discussed the importance of journaling consistently.  Exercise goals: As is.  Behavioral modification strategies: increasing lean protein intake, decreasing simple carbohydrates, increasing water intake, meal  planning and cooking strategies, keeping healthy foods in the home and planning for success.  [name] has agreed to follow-up with our clinic in 3 weeks. She was informed of the importance of frequent follow-up visits to maximize her success with intensive lifestyle modifications  for her multiple health conditions.   Objective:   Blood pressure 109/76, pulse 80, temperature 98 F (36.7 C), temperature source Oral, height 5' 6"  (1.676 m), weight 239 lb (108.4 kg), last menstrual period 12/12/2019, SpO2 99 %. Body mass index is 38.58 kg/m.  General: Cooperative, alert, well developed, in no acute distress. HEENT: Conjunctivae and lids unremarkable. Cardiovascular: Regular rhythm.  Lungs: Normal work of breathing. Neurologic: No focal deficits.   Lab Results  Component Value Date   CREATININE 0.62 12/21/2019   BUN 13 12/21/2019   NA 143 12/21/2019   K 4.5 12/21/2019   CL 108 (H) 12/21/2019   CO2 20 12/21/2019   Lab Results  Component Value Date   ALT 15 12/21/2019   AST 14 12/21/2019   ALKPHOS 122 (H) 12/21/2019   BILITOT 0.3 12/21/2019   Lab Results  Component Value Date   HGBA1C 5.0 07/28/2019   Lab Results  Component Value Date   INSULIN 19.7 12/21/2019   INSULIN 18.4 07/28/2019   Lab Results  Component Value Date   TSH 0.401 (L) 10/11/2019   Lab Results  Component Value Date   CHOL 174 07/28/2019   HDL 50 07/28/2019   LDLCALC 105 (H) 07/28/2019   TRIG 104 07/28/2019   Lab Results  Component Value Date   WBC 8.1 12/21/2019   HGB 14.9 12/21/2019   HCT 45.4 12/21/2019   MCV 86 12/21/2019   PLT 417 12/21/2019   No results found for: IRON, TIBC, FERRITIN  Attestation Statements:   Reviewed by clinician on day of visit: allergies, medications, problem list, medical history, surgical history, family history, social history, and previous encounter notes.   Wilhemena Durie, am acting as Location manager for [employer], FNP-C.  I have reviewed the above documentation for accuracy and completeness, and I agree with the above. -  Georgianne Fick, FNP

## 2020-01-12 ENCOUNTER — Encounter (INDEPENDENT_AMBULATORY_CARE_PROVIDER_SITE_OTHER): Payer: Self-pay | Admitting: Family Medicine

## 2020-01-12 DIAGNOSIS — E8881 Metabolic syndrome: Secondary | ICD-10-CM | POA: Insufficient documentation

## 2020-01-12 DIAGNOSIS — Z6838 Body mass index (BMI) 38.0-38.9, adult: Secondary | ICD-10-CM | POA: Insufficient documentation

## 2020-01-12 DIAGNOSIS — E559 Vitamin D deficiency, unspecified: Secondary | ICD-10-CM | POA: Insufficient documentation

## 2020-01-12 DIAGNOSIS — E88819 Insulin resistance, unspecified: Secondary | ICD-10-CM | POA: Insufficient documentation

## 2020-01-31 ENCOUNTER — Ambulatory Visit (INDEPENDENT_AMBULATORY_CARE_PROVIDER_SITE_OTHER): Payer: 59 | Admitting: Family Medicine

## 2020-01-31 ENCOUNTER — Other Ambulatory Visit: Payer: Self-pay

## 2020-01-31 ENCOUNTER — Encounter (INDEPENDENT_AMBULATORY_CARE_PROVIDER_SITE_OTHER): Payer: Self-pay | Admitting: Family Medicine

## 2020-01-31 VITALS — BP 112/77 | HR 76 | Temp 97.9°F | Ht 66.0 in | Wt 239.0 lb

## 2020-01-31 DIAGNOSIS — E538 Deficiency of other specified B group vitamins: Secondary | ICD-10-CM | POA: Diagnosis not present

## 2020-01-31 DIAGNOSIS — E559 Vitamin D deficiency, unspecified: Secondary | ICD-10-CM | POA: Diagnosis not present

## 2020-01-31 DIAGNOSIS — Z6838 Body mass index (BMI) 38.0-38.9, adult: Secondary | ICD-10-CM | POA: Diagnosis not present

## 2020-01-31 DIAGNOSIS — E8881 Metabolic syndrome: Secondary | ICD-10-CM | POA: Diagnosis not present

## 2020-01-31 DIAGNOSIS — E88819 Insulin resistance, unspecified: Secondary | ICD-10-CM

## 2020-01-31 DIAGNOSIS — E039 Hypothyroidism, unspecified: Secondary | ICD-10-CM | POA: Diagnosis not present

## 2020-01-31 DIAGNOSIS — Z9189 Other specified personal risk factors, not elsewhere classified: Secondary | ICD-10-CM

## 2020-01-31 MED ORDER — TRULICITY 0.75 MG/0.5ML ~~LOC~~ SOAJ: 0.7500 mg | SUBCUTANEOUS | 0 refills | Status: DC

## 2020-01-31 MED ORDER — "BD SAFETYGLIDE SYRINGE/NEEDLE 25G X 1"" 3 ML MISC": 0 refills | Status: DC

## 2020-01-31 MED ORDER — CYANOCOBALAMIN 1000 MCG/ML IJ SOLN: 1000.0000 ug | INTRAMUSCULAR | 0 refills | Status: DC

## 2020-01-31 MED FILL — TRULICITY 0.75 MG/0.5 ML PE: 0.75 | 28 days supply | Qty: 2 | Fill #0

## 2020-01-31 MED FILL — CYANOCOBALAMIN 1,000 MCG/ML: 1000 | 56 days supply | Qty: 4 | Fill #0

## 2020-01-31 NOTE — Progress Notes (Signed)
Chief Complaint:   OBESITY Rebecca Mcneil is here to discuss her progress with her obesity treatment plan along with follow-up of her obesity related diagnoses. Rebecca Mcneil is on keeping a food journal and adhering to recommended goals of 1800-2000 calories and 95-125 grams of protein and states she is following her eating plan approximately 40% of the time. Rebecca Mcneil states she is exercising for 0 minutes 0 times per week.  Today's visit was #: 12 Starting weight: 244 lbs Starting date: 07/28/2019 Today's weight: 239 lbs Today's date: 01/31/2020 Total lbs lost to date: 5 lbs Total lbs lost since last in-office visit: 0  Interim History: Rebecca Mcneil says she is having busy work days.  It is hard to plan and focus on self-care.    Subjective:   1. Insulin resistance Rebecca Mcneil has a diagnosis of insulin resistance based on her elevated fasting insulin level >5. She continues to work on diet and exercise to decrease her risk of diabetes.  She is taking metformin 500 mg daily.  Lab Results  Component Value Date   INSULIN 19.7 12/21/2019   INSULIN 18.4 07/28/2019   Lab Results  Component Value Date   HGBA1C 5.0 07/28/2019   2. Acquired hypothyroidism Rebecca Mcneil is taking levothyroxine 137 mcg daily.  3. B12 deficiency She is not a vegetarian.  She does not have a previous diagnosis of pernicious anemia.  She does not have a history of weight loss surgery.   Lab Results  Component Value Date   VITAMINB12 314 12/21/2019   4. Vitamin D deficiency Rebecca Mcneil Vitamin D level was 37.9 on 12/21/2019. She is currently taking prescription vitamin D 50,000 IU each week. She denies nausea, vomiting or muscle weakness.  5. At risk for constipation Rebecca Mcneil is at increased risk for constipation due to inadequate water intake, changes in diet, and/or use of medications such as GLP1 agonists. Rebecca Mcneil denies hard, infrequent stools currently.   Assessment/Plan:   1. Insulin resistance Rebecca Mcneil will continue to work on  weight loss, exercise, and decreasing simple carbohydrates to help decrease the risk of diabetes. Rebecca Mcneil agreed to follow-up with [place] as directed to closely monitor her progress.  Orders - Dulaglutide (TRULICITY) 2.72 ZD/6.6YQ SOPN; Inject 0.75 mg into the skin once a week.  Dispense: 4 pen; Refill: 0  2. Acquired hypothyroidism Patient with long-standing hypothyroidism, on levothyroxine therapy. She appears euthyroid. Orders and follow up as documented in patient record.  Counseling . Good thyroid control is important for overall health. Supratherapeutic thyroid levels are dangerous and will not improve weight loss results. . The correct way to take levothyroxine is fasting, with water, separated by at least 30 minutes from breakfast, and separated by more than 4 hours from calcium, iron, multivitamins, acid reflux medications (PPIs).   3. B12 deficiency The diagnosis was reviewed with the patient. Counseling provided today, see below. We will continue to monitor. Orders and follow up as documented in patient record.  Rebecca Mcneil will start B12 injections every other week.  Counseling . The body needs vitamin B12: to make red blood cells; to make DNA; and to help the nerves work properly so they can carry messages from the brain to the body.  . The main causes of vitamin B12 deficiency include dietary deficiency, digestive diseases, pernicious anemia, and having a surgery in which part of the stomach or small intestine is removed.  . Certain medicines can make it harder for the body to absorb vitamin B12. These medicines include: heartburn medications; some antibiotics;  some medications used to treat diabetes, gout, and high cholesterol.  . In some cases, there are no symptoms of this condition. If the condition leads to anemia or nerve damage, various symptoms can occur, such as weakness or fatigue, shortness of breath, and numbness or tingling in your hands and feet.   . Treatment:  o May include  taking vitamin B12 supplements.  o Avoid alcohol.  o Eat lots of healthy foods that contain vitamin B12: - Beef, pork, chicken, [place], and organ meats, such as liver.  - Seafood: This includes clams, rainbow trout, salmon, tuna, and haddock. Eggs.  - Cereal and dairy products that are fortified: This means that vitamin B12 has been added to the food.   Orders - cyanocobalamin (,VITAMIN B-12,) 1000 MCG/ML injection; Inject 1 mL (1,000 mcg total) into the muscle every 14 (fourteen) days.  Dispense: 4 mL; Refill: 0 - SYRINGE-NEEDLE, DISP, 3 ML (BD SAFETYGLIDE SYRINGE/NEEDLE) 25G X 1" 3 ML MISC; Use every 14 days to inject 1 mL vitamin B12 into muscle.  Dispense: 10 each; Refill: 0  4. Vitamin D deficiency Low Vitamin D level contributes to fatigue and are associated with obesity, breast, and colon cancer. She agrees to continue to take prescription Vitamin D @50 ,000 IU every week and will follow-up for routine testing of Vitamin D, at least 2-3 times per year to avoid over-replacement.  5. At risk for constipation Rebecca Mcneil was given approximately 15 minutes of counseling today regarding prevention of constipation. She was encouraged to increase water and fiber intake.   6. Class 2 severe obesity with serious comorbidity and body mass index (BMI) of 38.0 to 38.9 in adult, unspecified obesity type (HCC) Rebecca Mcneil is currently in the action stage of change. As such, her goal is to continue with weight loss efforts. She has agreed to keeping a food journal and adhering to recommended goals of 1800-2000 calories and 95-125 grams of protein.   Exercise goals: 5000 steps per day.  Behavioral modification strategies: increasing lean protein intake, increasing water intake and emotional eating strategies.  Rebecca Mcneil has agreed to follow-up with our clinic in 2 weeks. She was informed of the importance of frequent follow-up visits to maximize her success with intensive lifestyle modifications for her multiple  health conditions.   Objective:   Blood pressure 112/77, pulse 76, temperature 97.9 F (36.6 C), height 5' 6"  (1.676 m), weight 239 lb (108.4 kg), last menstrual period 12/30/2019, SpO2 100 %. Body mass index is 38.58 kg/m.  General: Cooperative, alert, well developed, in no acute distress. HEENT: Conjunctivae and lids unremarkable. Cardiovascular: Regular rhythm.  Lungs: Normal work of breathing. Neurologic: No focal deficits.   Lab Results  Component Value Date   CREATININE 0.62 12/21/2019   BUN 13 12/21/2019   NA 143 12/21/2019   K 4.5 12/21/2019   CL 108 (H) 12/21/2019   CO2 20 12/21/2019   Lab Results  Component Value Date   ALT 15 12/21/2019   AST 14 12/21/2019   ALKPHOS 122 (H) 12/21/2019   BILITOT 0.3 12/21/2019   Lab Results  Component Value Date   HGBA1C 5.0 07/28/2019   Lab Results  Component Value Date   INSULIN 19.7 12/21/2019   INSULIN 18.4 07/28/2019   Lab Results  Component Value Date   TSH 0.401 (L) 10/11/2019   Lab Results  Component Value Date   CHOL 174 07/28/2019   HDL 50 07/28/2019   LDLCALC 105 (H) 07/28/2019   TRIG 104 07/28/2019  Lab Results  Component Value Date   WBC 8.1 12/21/2019   HGB 14.9 12/21/2019   HCT 45.4 12/21/2019   MCV 86 12/21/2019   PLT 417 12/21/2019   Attestation Statements:   Reviewed by clinician on day of visit: allergies, medications, problem list, medical history, surgical history, family history, social history, and previous encounter notes.  I, Water quality scientist, CMA, am acting as Location manager for [employer], DO.  I have reviewed the above documentation for accuracy and completeness, and I agree with the above. Briscoe Deutscher, DO

## 2020-02-23 ENCOUNTER — Encounter (INDEPENDENT_AMBULATORY_CARE_PROVIDER_SITE_OTHER): Payer: Self-pay | Admitting: Family Medicine

## 2020-02-23 ENCOUNTER — Ambulatory Visit (INDEPENDENT_AMBULATORY_CARE_PROVIDER_SITE_OTHER): Payer: 59 | Admitting: Family Medicine

## 2020-02-23 ENCOUNTER — Other Ambulatory Visit: Payer: Self-pay

## 2020-02-23 VITALS — BP 100/66 | HR 80 | Temp 98.2°F | Ht 66.0 in | Wt 239.0 lb

## 2020-02-23 DIAGNOSIS — E559 Vitamin D deficiency, unspecified: Secondary | ICD-10-CM | POA: Diagnosis not present

## 2020-02-23 DIAGNOSIS — Z9189 Other specified personal risk factors, not elsewhere classified: Secondary | ICD-10-CM

## 2020-02-23 DIAGNOSIS — E538 Deficiency of other specified B group vitamins: Secondary | ICD-10-CM | POA: Diagnosis not present

## 2020-02-23 DIAGNOSIS — E039 Hypothyroidism, unspecified: Secondary | ICD-10-CM

## 2020-02-23 DIAGNOSIS — Z6838 Body mass index (BMI) 38.0-38.9, adult: Secondary | ICD-10-CM | POA: Diagnosis not present

## 2020-02-23 MED ORDER — "BD SAFETYGLIDE SYRINGE/NEEDLE 25G X 1"" 3 ML MISC": 0 refills | Status: DC

## 2020-02-23 MED ORDER — VITAMIN D (ERGOCALCIFEROL) 1.25 MG (50000 UNIT) PO CAPS: 50000.0000 [IU] | ORAL_CAPSULE | ORAL | 0 refills | Status: DC

## 2020-02-23 MED ORDER — LEVOTHYROXINE SODIUM 137 MCG PO TABS: 137.0000 ug | ORAL_TABLET | Freq: Every day | ORAL | 0 refills | Status: DC

## 2020-02-23 MED ORDER — TRULICITY 0.75 MG/0.5ML ~~LOC~~ SOAJ: 0.7500 mg | SUBCUTANEOUS | 0 refills | Status: DC

## 2020-02-23 MED ORDER — CYANOCOBALAMIN 1000 MCG/ML IJ SOLN: 1000.0000 ug | INTRAMUSCULAR | 0 refills | Status: DC

## 2020-02-23 NOTE — Progress Notes (Signed)
Chief Complaint:   OBESITY Rebecca Mcneil is here to discuss her progress with her obesity treatment plan along with follow-up of her obesity related diagnoses. [name] is on keeping a food journal and adhering to recommended goals of 1800-2000 calories and 95-125 grams of protein and states she is following her eating plan approximately 25% of the time. [name] states she is walking for 20-30 minutes 2 times per week.  Today's visit was #: 55 Starting weight: 244 lbs Starting date: 07/28/2019 Today's weight: 239 lbs Today's date: 02/23/2020 Total lbs lost to date: 5 lbs Total lbs lost since last in-office visit: 0  Interim History: [name] says that things are very stressful at work.  She is tolerating Trulicity.  She has decreased appetite and she sometimes forgets to eat.    Subjective:   1. Vitamin D deficiency Rebecca Mcneil's Vitamin D level was 37.9 on 12/21/2019. She is currently taking prescription vitamin D 50,000 IU each week. She denies nausea, vomiting or muscle weakness.  2. Vitamin B 12 deficiency  Lab Results  Component Value Date   VITAMINB12 314 12/21/2019   3. Hypothyroidism Rebecca Mcneil is taking levothyroxine 137 mcg daily.  Lab Results  Component Value Date   TSH 0.401 (L) 10/11/2019   Assessment/Plan:   1. Vitamin D deficiency Low Vitamin D level contributes to fatigue and are associated with obesity, breast, and colon cancer. She agrees to continue to take prescription Vitamin D @50 ,000 IU every week and will follow-up for routine testing of Vitamin D, at least 2-3 times per year to avoid over-replacement.  Orders - Vitamin D, Ergocalciferol, (DRISDOL) 1.25 MG (50000 UNIT) CAPS capsule; Take 1 capsule (50,000 Units total) by mouth every 7 (seven) days.  Dispense: 4 capsule; Refill: 0  2. Vitamin B 12 deficiency The diagnosis was reviewed with the patient. Counseling provided today, see below. We will continue to monitor. Orders and follow up as documented in patient  record.  Counseling . The body needs vitamin B12: to make red blood cells; to make DNA; and to help the nerves work properly so they can carry messages from the brain to the body.  . The main causes of vitamin B12 deficiency include dietary deficiency, digestive diseases, pernicious anemia, and having a surgery in which part of the stomach or small intestine is removed.  . Certain medicines can make it harder for the body to absorb vitamin B12. These medicines include: heartburn medications; some antibiotics; some medications used to treat diabetes, gout, and high cholesterol.  . In some cases, there are no symptoms of this condition. If the condition leads to anemia or nerve damage, various symptoms can occur, such as weakness or fatigue, shortness of breath, and numbness or tingling in your hands and feet.   . Treatment:  o May include taking vitamin B12 supplements.  o Avoid alcohol.  o Eat lots of healthy foods that contain vitamin B12: - Beef, pork, chicken, [place], and organ meats, such as liver.  - Seafood: This includes clams, rainbow trout, salmon, tuna, and haddock. Eggs.  - Cereal and dairy products that are fortified: This means that vitamin B12 has been added to the food.   Orders - SYRINGE-NEEDLE, DISP, 3 ML (BD SAFETYGLIDE SYRINGE/NEEDLE) 25G X 1" 3 ML MISC; Use every 14 days to inject 1 mL vitamin B12 into muscle.  Dispense: 10 each; Refill: 0 - cyanocobalamin (,VITAMIN B-12,) 1000 MCG/ML injection; Inject 1 mL (1,000 mcg total) into the muscle every 14 (fourteen) days.  Dispense: 4 mL; Refill: 0  3. Hypothyroidism Patient with long-standing hypothyroidism, on levothyroxine therapy. She appears euthyroid. Orders and follow up as documented in patient record.  Counseling . Good thyroid control is important for overall health. Supratherapeutic thyroid levels are dangerous and will not improve weight loss results. . The correct way to take levothyroxine is fasting, with water,  separated by at least 30 minutes from breakfast, and separated by more than 4 hours from calcium, iron, multivitamins, acid reflux medications (PPIs).  - levothyroxine (SYNTHROID) 137 MCG tablet; Take 1 tablet (137 mcg total) by mouth daily before breakfast.  Dispense: 90 tablet; Refill: 0  4. At risk for deficient intake of food Rebecca Mcneil was given approximately 15 minutes of deficit intake of food prevention counseling today. Rebecca Mcneil is at risk for eating too few calories based on current food recall. She was encouraged to focus on meeting caloric and protein goals according to her recommended meal plan.   5. Class 2 severe obesity with serious comorbidity and body mass index (BMI) of 38.0 to 38.9 in adult, unspecified obesity type (HCC) Rebecca Mcneil is currently in the action stage of change. As such, her goal is to continue with weight loss efforts. She has agreed to keeping a food journal and adhering to recommended goals of 1500-1800 calories and 85+ grams of protein.   Exercise goals: For substantial health benefits, adults should do at least 150 minutes (2 hours and 30 minutes) a week of moderate-intensity, or 75 minutes (1 hour and 15 minutes) a week of vigorous-intensity aerobic physical activity, or an equivalent combination of moderate- and vigorous-intensity aerobic activity. Aerobic activity should be performed in episodes of at least 10 minutes, and preferably, it should be spread throughout the week.  Behavioral modification strategies: increasing lean protein intake, decreasing simple carbohydrates, increasing water intake, no skipping meals and meal planning and cooking strategies.  Rebecca Mcneil has agreed to follow-up with our clinic in 3 weeks. She was informed of the importance of frequent follow-up visits to maximize her success with intensive lifestyle modifications for her multiple health conditions.   Objective:   Blood pressure 100/66, pulse 80, temperature 98.2 F (36.8 C), temperature  source Oral, height 5' 6"  (1.676 m), weight 239 lb (108.4 kg), last menstrual period 02/04/2020, SpO2 99 %. Body mass index is 38.58 kg/m.  General: Cooperative, alert, well developed, in no acute distress. HEENT: Conjunctivae and lids unremarkable. Cardiovascular: Regular rhythm.  Lungs: Normal work of breathing. Neurologic: No focal deficits.   Lab Results  Component Value Date   CREATININE 0.62 12/21/2019   BUN 13 12/21/2019   NA 143 12/21/2019   K 4.5 12/21/2019   CL 108 (H) 12/21/2019   CO2 20 12/21/2019   Lab Results  Component Value Date   ALT 15 12/21/2019   AST 14 12/21/2019   ALKPHOS 122 (H) 12/21/2019   BILITOT 0.3 12/21/2019   Lab Results  Component Value Date   HGBA1C 5.0 07/28/2019   Lab Results  Component Value Date   INSULIN 19.7 12/21/2019   INSULIN 18.4 07/28/2019   Lab Results  Component Value Date   TSH 0.401 (L) 10/11/2019   Lab Results  Component Value Date   CHOL 174 07/28/2019   HDL 50 07/28/2019   LDLCALC 105 (H) 07/28/2019   TRIG 104 07/28/2019   Lab Results  Component Value Date   WBC 8.1 12/21/2019   HGB 14.9 12/21/2019   HCT 45.4 12/21/2019   MCV 86 12/21/2019   PLT  417 12/21/2019   Attestation Statements:   Reviewed by clinician on day of visit: allergies, medications, problem list, medical history, surgical history, family history, social history, and previous encounter notes.  I, Water quality scientist, CMA, am acting as transcriptionist for Briscoe Deutscher, DO   I have reviewed the above documentation for accuracy and completeness, and I agree with the above. Briscoe Deutscher, DO

## 2020-03-15 ENCOUNTER — Ambulatory Visit (INDEPENDENT_AMBULATORY_CARE_PROVIDER_SITE_OTHER): Payer: 59 | Admitting: Family Medicine

## 2020-03-15 MED FILL — CYANOCOBALAMIN 1,000 MCG/ML: 1000 | 56 days supply | Qty: 4 | Fill #0

## 2020-03-23 ENCOUNTER — Other Ambulatory Visit (INDEPENDENT_AMBULATORY_CARE_PROVIDER_SITE_OTHER): Payer: Self-pay | Admitting: Family Medicine

## 2020-03-23 MED ORDER — TRULICITY 0.75 MG/0.5ML ~~LOC~~ SOAJ: 0.7500 mg | SUBCUTANEOUS | 0 refills | Status: DC

## 2020-03-23 MED FILL — TRULICITY 0.75 MG/0.5 ML PE: 0.75 | 28 days supply | Qty: 2 | Fill #0

## 2020-04-10 ENCOUNTER — Ambulatory Visit (INDEPENDENT_AMBULATORY_CARE_PROVIDER_SITE_OTHER): Payer: 59 | Admitting: Family Medicine

## 2020-04-10 MED FILL — TRULICITY 0.75 MG/0.5 ML PE: 0.75 | 28 days supply | Qty: 2 | Fill #0

## 2020-04-11 ENCOUNTER — Encounter (INDEPENDENT_AMBULATORY_CARE_PROVIDER_SITE_OTHER): Payer: Self-pay | Admitting: Family Medicine

## 2020-04-11 ENCOUNTER — Ambulatory Visit (INDEPENDENT_AMBULATORY_CARE_PROVIDER_SITE_OTHER): Payer: 59 | Admitting: Family Medicine

## 2020-04-11 ENCOUNTER — Other Ambulatory Visit: Payer: Self-pay

## 2020-04-11 VITALS — BP 107/71 | HR 72 | Temp 98.1°F | Ht 66.0 in | Wt 239.0 lb

## 2020-04-11 DIAGNOSIS — E8881 Metabolic syndrome: Secondary | ICD-10-CM | POA: Diagnosis not present

## 2020-04-11 DIAGNOSIS — Z6838 Body mass index (BMI) 38.0-38.9, adult: Secondary | ICD-10-CM

## 2020-04-11 DIAGNOSIS — E559 Vitamin D deficiency, unspecified: Secondary | ICD-10-CM | POA: Diagnosis not present

## 2020-04-11 DIAGNOSIS — Z9189 Other specified personal risk factors, not elsewhere classified: Secondary | ICD-10-CM | POA: Diagnosis not present

## 2020-04-11 MED ORDER — VITAMIN D (ERGOCALCIFEROL) 1.25 MG (50000 UNIT) PO CAPS: 50000.0000 [IU] | ORAL_CAPSULE | ORAL | 0 refills | Status: DC

## 2020-04-11 MED FILL — VIT D2 1.25 MG (50,000 UNIT: 1.25 MG | 84 days supply | Qty: 12 | Fill #0

## 2020-04-11 NOTE — Progress Notes (Signed)
Chief Complaint:   OBESITY Rebecca Mcneil is here to discuss her progress with her obesity treatment plan along with follow-up of her obesity related diagnoses. Rebecca Mcneil is on keeping a food journal and adhering to recommended goals of 1500-1800 calories and 85+ grams of protein daily and states she is following her eating plan approximately 75% of the time. Rebecca Mcneil states she is doing 0 minutes 0 times per week.  Today's visit was #: 14 Starting weight: 244 lbs Starting date: 07/28/2019 Today's weight: 239 lbs Today's date: 04/11/2020 Total lbs lost to date: 5 Total lbs lost since last in-office visit: 0  Interim History: Rebecca Mcneil notes frustration with being plateaued for so long. We discussed the Low carbohydrate plan and she would like to give this a try. She struggles to take time for lunch at work. She is a Engineer, building services.  Subjective:   1. Vitamin D deficiency Rebecca Mcneil's last Vit D level was low at 47.4. She is on prescription Vit D.  2. Insulin resistance Rebecca Mcneil has a diagnosis of insulin resistance based on her elevated fasting insulin level >5. Rebecca Mcneil is on Trulicity. She denies nausea. She notes constipation which she has always had issues with. She denies polyphagia. She notes bruising at injection sites. She continues to work on diet and exercise to decrease her risk of diabetes.  Lab Results  Component Value Date   INSULIN 19.7 12/21/2019   INSULIN 18.4 07/28/2019   Lab Results  Component Value Date   HGBA1C 5.0 07/28/2019   3. At risk for malnutrition Anadia is at increased risk for malnutrition due to skipping meals and inadequate protein.  Assessment/Plan:   1. Vitamin D deficiency Low Vitamin D level contributes to fatigue and are associated with obesity, breast, and colon cancer. We will refill prescription Vitamin D for 1 month. Li will follow-up for routine testing of Vitamin D, at least 2-3 times per year to avoid over-replacement. We will  recheck labs at her next office visit.  - Vitamin D, Ergocalciferol, (DRISDOL) 1.25 MG (50000 UNIT) CAPS capsule; Take 1 capsule (50,000 Units total) by mouth every 7 (seven) days.  Dispense: 12 capsule; Refill: 0  2. Insulin resistance Rebecca Mcneil will continue to work on weight loss, exercise, and decreasing simple carbohydrates to help decrease the risk of diabetes. Rebecca Mcneil will continue Trulicity. She is to take miralax OTC as needed, and may use daily. Rebecca Mcneil agreed to follow-up with [place] as directed to closely monitor her progress.  3. At risk for malnutrition Rebecca Mcneil was given approximately 15 minutes of counseling today regarding prevention of malnutrition and ways to meet macronutrient goals..   4. Class 2 severe obesity with serious comorbidity and body mass index (BMI) of 38.0 to 38.9 in adult, unspecified obesity type (HCC) Rebecca Mcneil is currently in the action stage of change. As such, her goal is to continue with weight loss efforts. She has agreed to following a lower carbohydrate, vegetable and lean protein rich diet plan.   We discussed meal ideas for the Low carbohydrate plan.  Exercise goals: All adults should avoid inactivity. Some physical activity is better than none, and adults who participate in any amount of physical activity gain some health benefits.  Behavioral modification strategies: decreasing simple carbohydrates, no skipping meals and meal planning and cooking strategies.  Rebecca Mcneil has agreed to follow-up with our clinic in 3 weeks. She was informed of the importance of frequent follow-up visits to maximize her success with intensive lifestyle modifications for her multiple  health conditions.   Objective:   Blood pressure 107/71, pulse 72, temperature 98.1 F (36.7 C), temperature source Oral, height 5' 6"  (1.676 m), weight (!) 239 lb (108.4 kg), SpO2 100 %. Body mass index is 38.58 kg/m.  General: Cooperative, alert, well developed, in no acute distress. HEENT:  Conjunctivae and lids unremarkable. Cardiovascular: Regular rhythm.  Lungs: Normal work of breathing. Neurologic: No focal deficits.   Lab Results  Component Value Date   CREATININE 0.62 12/21/2019   BUN 13 12/21/2019   NA 143 12/21/2019   K 4.5 12/21/2019   CL 108 (H) 12/21/2019   CO2 20 12/21/2019   Lab Results  Component Value Date   ALT 15 12/21/2019   AST 14 12/21/2019   ALKPHOS 122 (H) 12/21/2019   BILITOT 0.3 12/21/2019   Lab Results  Component Value Date   HGBA1C 5.0 07/28/2019   Lab Results  Component Value Date   INSULIN 19.7 12/21/2019   INSULIN 18.4 07/28/2019   Lab Results  Component Value Date   TSH 0.401 (L) 10/11/2019   Lab Results  Component Value Date   CHOL 174 07/28/2019   HDL 50 07/28/2019   LDLCALC 105 (H) 07/28/2019   TRIG 104 07/28/2019   Lab Results  Component Value Date   WBC 8.1 12/21/2019   HGB 14.9 12/21/2019   HCT 45.4 12/21/2019   MCV 86 12/21/2019   PLT 417 12/21/2019   No results found for: IRON, TIBC, FERRITIN  Attestation Statements:   Reviewed by clinician on day of visit: allergies, medications, problem list, medical history, surgical history, family history, social history, and previous encounter notes.   Wilhemena Durie, am acting as Location manager for [employer], FNP-C.  I have reviewed the above documentation for accuracy and completeness, and I agree with the above. -  Georgianne Fick, FNP

## 2020-04-21 MED FILL — VIT D2 1.25 MG (50,000 UNIT: 1.25 MG | 84 days supply | Qty: 12 | Fill #0

## 2020-05-03 ENCOUNTER — Ambulatory Visit (INDEPENDENT_AMBULATORY_CARE_PROVIDER_SITE_OTHER): Payer: 59 | Admitting: Family Medicine

## 2020-05-08 ENCOUNTER — Other Ambulatory Visit (INDEPENDENT_AMBULATORY_CARE_PROVIDER_SITE_OTHER): Payer: Self-pay | Admitting: Family Medicine

## 2020-05-08 DIAGNOSIS — E039 Hypothyroidism, unspecified: Secondary | ICD-10-CM

## 2020-05-11 DIAGNOSIS — M545 Low back pain: Secondary | ICD-10-CM | POA: Diagnosis not present

## 2020-05-11 DIAGNOSIS — M5442 Lumbago with sciatica, left side: Secondary | ICD-10-CM | POA: Diagnosis not present

## 2020-05-11 DIAGNOSIS — M5441 Lumbago with sciatica, right side: Secondary | ICD-10-CM | POA: Diagnosis not present

## 2020-06-20 DIAGNOSIS — E559 Vitamin D deficiency, unspecified: Secondary | ICD-10-CM | POA: Diagnosis not present

## 2020-06-20 DIAGNOSIS — E039 Hypothyroidism, unspecified: Secondary | ICD-10-CM | POA: Diagnosis not present

## 2020-06-20 DIAGNOSIS — Z0000 Encounter for general adult medical examination without abnormal findings: Secondary | ICD-10-CM | POA: Diagnosis not present

## 2020-06-20 MED FILL — VIT D3-50 50,000 UNITS CAPS: 1.25 MG | 84 days supply | Qty: 12 | Fill #0

## 2020-07-21 ENCOUNTER — Ambulatory Visit (INDEPENDENT_AMBULATORY_CARE_PROVIDER_SITE_OTHER): Payer: 59

## 2020-07-21 ENCOUNTER — Other Ambulatory Visit: Payer: Self-pay

## 2020-07-21 DIAGNOSIS — Z23 Encounter for immunization: Secondary | ICD-10-CM

## 2020-08-21 ENCOUNTER — Other Ambulatory Visit (HOSPITAL_COMMUNITY): Payer: Self-pay | Admitting: Family Medicine

## 2020-08-21 MED FILL — LEVOTHYROXINE SODIUM 125 MC: 125 | 90 days supply | Qty: 90 | Fill #0

## 2020-09-14 DIAGNOSIS — Z20822 Contact with and (suspected) exposure to covid-19: Secondary | ICD-10-CM | POA: Diagnosis not present

## 2020-10-11 DIAGNOSIS — Z124 Encounter for screening for malignant neoplasm of cervix: Secondary | ICD-10-CM | POA: Diagnosis not present

## 2020-10-11 DIAGNOSIS — Z800 Family history of malignant neoplasm of digestive organs: Secondary | ICD-10-CM | POA: Diagnosis not present

## 2020-10-11 DIAGNOSIS — Z01411 Encounter for gynecological examination (general) (routine) with abnormal findings: Secondary | ICD-10-CM | POA: Diagnosis not present

## 2020-10-11 DIAGNOSIS — Z6841 Body Mass Index (BMI) 40.0 and over, adult: Secondary | ICD-10-CM | POA: Diagnosis not present

## 2020-10-11 DIAGNOSIS — Z01419 Encounter for gynecological examination (general) (routine) without abnormal findings: Secondary | ICD-10-CM | POA: Diagnosis not present

## 2020-10-11 DIAGNOSIS — N926 Irregular menstruation, unspecified: Secondary | ICD-10-CM | POA: Diagnosis not present

## 2020-10-11 DIAGNOSIS — Z113 Encounter for screening for infections with a predominantly sexual mode of transmission: Secondary | ICD-10-CM | POA: Diagnosis not present

## 2020-11-02 DIAGNOSIS — N926 Irregular menstruation, unspecified: Secondary | ICD-10-CM | POA: Diagnosis not present

## 2020-11-08 DIAGNOSIS — Z3202 Encounter for pregnancy test, result negative: Secondary | ICD-10-CM | POA: Diagnosis not present

## 2020-11-08 DIAGNOSIS — N926 Irregular menstruation, unspecified: Secondary | ICD-10-CM | POA: Diagnosis not present

## 2020-11-30 ENCOUNTER — Other Ambulatory Visit (HOSPITAL_COMMUNITY): Payer: Self-pay | Admitting: Family Medicine

## 2020-12-22 DIAGNOSIS — Z0100 Encounter for examination of eyes and vision without abnormal findings: Secondary | ICD-10-CM | POA: Diagnosis not present

## 2021-01-03 ENCOUNTER — Other Ambulatory Visit (HOSPITAL_COMMUNITY): Payer: Self-pay

## 2021-01-03 MED ORDER — PROMETHAZINE HCL 25 MG PO TABS
25.0000 mg | ORAL_TABLET | Freq: Four times a day (QID) | ORAL | 0 refills | Status: DC | PRN
  Filled 2021-01-03: qty 12, 3d supply, fill #0

## 2021-01-03 MED ORDER — IBUPROFEN 800 MG PO TABS
800.0000 mg | ORAL_TABLET | ORAL | 0 refills | Status: DC
  Filled 2021-01-03: qty 30, 10d supply, fill #0

## 2021-01-04 ENCOUNTER — Other Ambulatory Visit (HOSPITAL_COMMUNITY): Payer: Self-pay

## 2021-01-04 MED ORDER — OXYCODONE-ACETAMINOPHEN 5-325 MG PO TABS
2.0000 | ORAL_TABLET | ORAL | 0 refills | Status: DC
  Filled 2021-01-04: qty 10, 2d supply, fill #0

## 2021-01-08 DIAGNOSIS — Z3202 Encounter for pregnancy test, result negative: Secondary | ICD-10-CM | POA: Diagnosis not present

## 2021-01-08 DIAGNOSIS — N840 Polyp of corpus uteri: Secondary | ICD-10-CM | POA: Diagnosis not present

## 2021-01-08 HISTORY — PX: POLYPECTOMY: SHX149

## 2021-01-23 DIAGNOSIS — Z30430 Encounter for insertion of intrauterine contraceptive device: Secondary | ICD-10-CM | POA: Diagnosis not present

## 2021-01-23 DIAGNOSIS — Z09 Encounter for follow-up examination after completed treatment for conditions other than malignant neoplasm: Secondary | ICD-10-CM | POA: Diagnosis not present

## 2021-03-01 DIAGNOSIS — Z30431 Encounter for routine checking of intrauterine contraceptive device: Secondary | ICD-10-CM | POA: Diagnosis not present

## 2021-03-17 MED FILL — Levothyroxine Sodium Tab 125 MCG: ORAL | 90 days supply | Qty: 90 | Fill #0 | Status: CN

## 2021-03-20 ENCOUNTER — Other Ambulatory Visit (HOSPITAL_COMMUNITY): Payer: Self-pay

## 2021-03-28 ENCOUNTER — Other Ambulatory Visit (HOSPITAL_COMMUNITY): Payer: Self-pay

## 2021-04-05 ENCOUNTER — Other Ambulatory Visit (HOSPITAL_COMMUNITY): Payer: Self-pay

## 2021-04-05 MED ORDER — D3-50 1.25 MG (50000 UT) PO CAPS
ORAL_CAPSULE | ORAL | 0 refills | Status: AC
  Filled 2021-04-05: qty 12, 84d supply, fill #0

## 2021-04-05 MED ORDER — LEVOTHYROXINE SODIUM 125 MCG PO TABS
125.0000 ug | ORAL_TABLET | Freq: Every day | ORAL | 3 refills | Status: AC
  Filled 2021-04-05: qty 90, 90d supply, fill #0

## 2021-07-31 DIAGNOSIS — Z0289 Encounter for other administrative examinations: Secondary | ICD-10-CM

## 2021-09-05 ENCOUNTER — Ambulatory Visit (INDEPENDENT_AMBULATORY_CARE_PROVIDER_SITE_OTHER): Payer: No Typology Code available for payment source | Admitting: Bariatrics

## 2021-09-05 ENCOUNTER — Encounter (INDEPENDENT_AMBULATORY_CARE_PROVIDER_SITE_OTHER): Payer: Self-pay | Admitting: Bariatrics

## 2021-09-05 ENCOUNTER — Other Ambulatory Visit: Payer: Self-pay

## 2021-09-05 VITALS — BP 117/82 | HR 87 | Temp 98.0°F | Ht 67.0 in | Wt 259.0 lb

## 2021-09-05 DIAGNOSIS — E038 Other specified hypothyroidism: Secondary | ICD-10-CM

## 2021-09-05 DIAGNOSIS — Z1331 Encounter for screening for depression: Secondary | ICD-10-CM

## 2021-09-05 DIAGNOSIS — R0602 Shortness of breath: Secondary | ICD-10-CM | POA: Diagnosis not present

## 2021-09-05 DIAGNOSIS — R5383 Other fatigue: Secondary | ICD-10-CM | POA: Diagnosis not present

## 2021-09-05 DIAGNOSIS — E559 Vitamin D deficiency, unspecified: Secondary | ICD-10-CM | POA: Diagnosis not present

## 2021-09-05 DIAGNOSIS — Z6841 Body Mass Index (BMI) 40.0 and over, adult: Secondary | ICD-10-CM

## 2021-09-05 DIAGNOSIS — E538 Deficiency of other specified B group vitamins: Secondary | ICD-10-CM

## 2021-09-05 DIAGNOSIS — E8881 Metabolic syndrome: Secondary | ICD-10-CM

## 2021-09-05 NOTE — Progress Notes (Signed)
Chief Complaint:   OBESITY Rebecca Mcneil (MR# [id number]) is a 35 y.o. female who presents for evaluation and treatment of obesity and related comorbidities. Current BMI is Body mass index is 40.57 kg/m. Rebecca Mcneil has been struggling with her weight for many years and has been unsuccessful in either losing weight, maintaining weight loss, or reaching her healthy weight goal.  Rebecca Mcneil states that she rarely likes to cook. She states that she craves sweets and bread. She is a returning patient of Dr. Juleen China. She is in a better place with her job and life. She feels like she can focus more.   Rebecca Mcneil is currently in the action stage of change and ready to dedicate time achieving and maintaining a healthier weight. Rebecca Mcneil is interested in becoming our patient and working on intensive lifestyle modifications including (but not limited to) diet and exercise for weight loss.  [name]'s habits were reviewed today and are as follows: Her family eats meals together, she struggles with family and or coworkers weight loss sabotage, her desired weight loss is 90 lbs, she has been heavy most of her life, she started gaining weight in 2017, her heaviest weight ever was 260 pounds, she is a picky eater and doesn't like to eat healthier foods, she has significant food cravings issues, she snacks frequently in the evenings, she skips meals frequently, she is frequently drinking liquids with calories, she frequently makes poor food choices, she has problems with excessive hunger, she frequently eats larger portions than normal, she has binge eating behaviors, and she struggles with emotional eating.  Depression Screen Kaliyan's Food and Mood (modified PHQ-9) score was 8.  Depression screen PHQ 2/9 09/05/2021  Decreased Interest 1  Down, Depressed, Hopeless 1  PHQ - 2 Score 2  Altered sleeping 1  Tired, decreased energy 2  Change in appetite 1  Feeling bad or failure about yourself  1  Trouble concentrating 1   Moving slowly or fidgety/restless 0  Suicidal thoughts 0  PHQ-9 Score 8  Difficult doing work/chores Not difficult at all   Subjective:   1. Other fatigue Rebecca Mcneil reports daytime somnolence and admits to waking up still tired. Patent has a history of symptoms of morning fatigue. Rebecca Mcneil generally gets 7 hours of sleep per night, and states that she has difficulty falling asleep. Snoring is not present. Apneic episodes is not present. Epworth Sleepiness Score is 3.   2. SOB (shortness of breath) Rebecca Mcneil notes increasing shortness of breath with exercising and seems to be worsening over time with weight gain. She notes getting out of breath sooner with activity than she used to. This has not gotten worse recently. Rebecca Mcneil denies shortness of breath at rest or orthopnea.   3. Other specified hypothyroidism Rebecca Mcneil is currently taking Levothyroxine.  4. Vitamin D deficiency Rebecca Mcneil is taking high dose Vitamin D dose currently. Her last Vitamin D level was 25.0  5. Vitamin B12 deficiency Rebecca Mcneil is currently not on medications.   6. Insulin resistance Rebecca Mcneil's last Insulin resistance level was 18.4. She is not on medications currently.  7. Depression screen Allan is struggling with emotional eating and using food for comfort to the extent that it is negatively impacting her health. She has been working on behavior modification techniques to help reduce her emotional eating. She shows no sign of suicidal or homicidal ideations.   Assessment/Plan:   1. Other fatigue Rebecca Mcneil does feel that her weight is causing her energy to be lower than it should  be. Fatigue may be related to obesity, depression or many other causes. Labs will be ordered, and in the meanwhile, Rebecca Mcneil will focus on self care including making healthy food choices, increasing physical activity and focusing on stress reduction.   2. SOB (shortness of breath) Rebecca Mcneil does feel that she gets out of breath more easily that she used to  when she exercises. Rebecca Mcneil's shortness of breath appears to be obesity related and exercise induced. She has agreed to work on weight loss and gradually increase exercise to treat her exercise induced shortness of breath. Will continue to monitor closely.   3. Other specified hypothyroidism Rebecca Mcneil will continue Levothyroxine. She will start supplements per her primary care provider. We will recheck thyroid in 6 weeks. Orders and follow up as documented in patient record.  Counseling Good thyroid control is important for overall health. Supratherapeutic thyroid levels are dangerous and will not improve weight loss results. Counseling: The correct way to take levothyroxine is fasting, with water, separated by at least 30 minutes from breakfast, and separated by more than 4 hours from calcium, iron, multivitamins, acid reflux medications (PPIs).    4. Vitamin D deficiency Low Vitamin D level contributes to fatigue and are associated with obesity, breast, and colon cancer. Rebecca Mcneil agrees to continue to take prescription Vitamin D 50,000 IU every week and she will follow-up for routine testing of Vitamin D, at least 2-3 times per year to avoid over-replacement.  5. Vitamin B12 deficiency The diagnosis was reviewed with the patient. Counseling provided today, see below. Rebecca Mcneil will continue taking OTC Vitamin B12 (intramuscular) 1000 mcg daily. We will continue to monitor. Orders and follow up as documented in patient record.  Counseling The body needs vitamin B12: to make red blood cells; to make DNA; and to help the nerves work properly so they can carry messages from the brain to the body.  The main causes of vitamin B12 deficiency include dietary deficiency, digestive diseases, pernicious anemia, and having a surgery in which part of the stomach or small intestine is removed.  Certain medicines can make it harder for the body to absorb vitamin B12. These medicines include: heartburn medications; some  antibiotics; some medications used to treat diabetes, gout, and high cholesterol.  In some cases, there are no symptoms of this condition. If the condition leads to anemia or nerve damage, various symptoms can occur, such as weakness or fatigue, shortness of breath, and numbness or tingling in your hands and feet.   Treatment:  May include taking vitamin B12 supplements.  Avoid alcohol.  Eat lots of healthy foods that contain vitamin B12: Beef, pork, chicken, [place], and organ meats, such as liver.  Seafood: This includes clams, rainbow trout, salmon, tuna, and haddock. Eggs.  Cereal and dairy products that are fortified: This means that vitamin B12 has been added to the food.    6. Insulin resistance Shavone will continue to work on weight loss, exercise, and decreasing simple carbohydrates to help decrease the risk of diabetes. We will check A1C and insulin today. Lilliemae agreed to follow-up with [place] as directed to closely monitor her progress.  - Hemoglobin A1c - Insulin, random  7. Depression screen Behavior modification techniques were discussed today to help Taci deal with her emotional/non-hunger eating behaviors.  Orders and follow up as documented in patient record.    8. Obesity with current BMI of 40.6 Tomika is currently in the action stage of change and her goal is to continue with weight loss  efforts. I recommend Jalasia begin the structured treatment plan as follows:  She has agreed to the Category 3 Plan or keeping a food journal and adhering to recommended goals of 1500 calories and 90-100 grams of protein daily.  Vincie will continue meal planning and she will be mindful eating. We reviewed labs Vitamin D, B12, and CMP. She will try not to skip meals. She will increase water and avoid sugary drinks.  Exercise goals: No exercise has been prescribed at this time.   Behavioral modification strategies: increasing lean protein intake, decreasing simple carbohydrates, increasing  vegetables, increasing water intake, decreasing eating out, no skipping meals, meal planning and cooking strategies, keeping healthy foods in the home, and planning for success.  She was informed of the importance of frequent follow-up visits to maximize her success with intensive lifestyle modifications for her multiple health conditions. She was informed we would discuss her lab results at her next visit unless there is a critical issue that needs to be addressed sooner. Madia agreed to keep her next visit at the agreed upon time to discuss these results.  Objective:   Blood pressure 117/82, pulse 87, temperature 98 F (36.7 C), height 5' 7"  (1.702 m), weight 259 lb (117.5 kg), last menstrual period 09/03/2021, SpO2 100 %. Body mass index is 40.57 kg/m.  EKG: Normal sinus rhythm, rate 84 bpm.  Indirect Calorimeter completed today shows a VO2 of 319 and a REE of 2203.  Her calculated basal metabolic rate is 1308 thus her basal metabolic rate is better than expected.  General: Cooperative, alert, well developed, in no acute distress. HEENT: Conjunctivae and lids unremarkable. Cardiovascular: Regular rhythm.  Lungs: Normal work of breathing. Neurologic: No focal deficits.   Lab Results  Component Value Date   CREATININE 0.62 12/21/2019   BUN 13 12/21/2019   NA 143 12/21/2019   K 4.5 12/21/2019   CL 108 (H) 12/21/2019   CO2 20 12/21/2019   Lab Results  Component Value Date   ALT 15 12/21/2019   AST 14 12/21/2019   ALKPHOS 122 (H) 12/21/2019   BILITOT 0.3 12/21/2019   Lab Results  Component Value Date   HGBA1C 5.0 07/28/2019   Lab Results  Component Value Date   INSULIN 19.7 12/21/2019   INSULIN 18.4 07/28/2019   Lab Results  Component Value Date   TSH 0.401 (L) 10/11/2019   Lab Results  Component Value Date   CHOL 174 07/28/2019   HDL 50 07/28/2019   LDLCALC 105 (H) 07/28/2019   TRIG 104 07/28/2019   Lab Results  Component Value Date   WBC 8.1 12/21/2019    HGB 14.9 12/21/2019   HCT 45.4 12/21/2019   MCV 86 12/21/2019   PLT 417 12/21/2019   No results found for: IRON, TIBC, FERRITIN  Attestation Statements:   Reviewed by clinician on day of visit: allergies, medications, problem list, medical history, surgical history, family history, social history, and previous encounter notes.  I, Lizbeth Bark, RMA, am acting as Location manager for [employer], DO.  I have reviewed the above documentation for accuracy and completeness, and I agree with the above. Jearld Lesch, DO

## 2021-09-06 LAB — HEMOGLOBIN A1C
Est. average glucose Bld gHb Est-mCnc: 108 mg/dL
Hgb A1c MFr Bld: 5.4 % (ref 4.8–5.6)

## 2021-09-06 LAB — INSULIN, RANDOM: INSULIN: 14.8 u[IU]/mL (ref 2.6–24.9)

## 2021-09-11 ENCOUNTER — Encounter (INDEPENDENT_AMBULATORY_CARE_PROVIDER_SITE_OTHER): Payer: Self-pay | Admitting: Bariatrics

## 2021-09-19 ENCOUNTER — Encounter (INDEPENDENT_AMBULATORY_CARE_PROVIDER_SITE_OTHER): Payer: Self-pay | Admitting: Bariatrics

## 2021-09-19 ENCOUNTER — Other Ambulatory Visit: Payer: Self-pay

## 2021-09-19 ENCOUNTER — Ambulatory Visit (INDEPENDENT_AMBULATORY_CARE_PROVIDER_SITE_OTHER): Payer: No Typology Code available for payment source | Admitting: Bariatrics

## 2021-09-19 VITALS — BP 124/85 | HR 82 | Temp 98.1°F | Ht 67.0 in | Wt 256.0 lb

## 2021-09-19 DIAGNOSIS — Z6841 Body Mass Index (BMI) 40.0 and over, adult: Secondary | ICD-10-CM

## 2021-09-19 DIAGNOSIS — E8881 Metabolic syndrome: Secondary | ICD-10-CM | POA: Diagnosis not present

## 2021-09-19 DIAGNOSIS — E038 Other specified hypothyroidism: Secondary | ICD-10-CM | POA: Diagnosis not present

## 2021-09-20 ENCOUNTER — Encounter (INDEPENDENT_AMBULATORY_CARE_PROVIDER_SITE_OTHER): Payer: Self-pay | Admitting: Bariatrics

## 2021-09-20 NOTE — Progress Notes (Signed)
Chief Complaint:   OBESITY Rebecca Mcneil is here to discuss her progress with her obesity treatment plan along with follow-up of her obesity related diagnoses. Rebecca Mcneil is on keeping a food journal and adhering to recommended goals of 1500 calories and 90 grams of protein and states she is following her eating plan approximately 25% of the time. Rebecca Mcneil states she is doing 0 minutes 0 times per week.  Today's visit was #: 2 Starting weight: 259 lbs Starting date: 09/05/2021 Today's weight: 256 lbs Today's date: 09/19/2021 Total lbs lost to date: 3 lbs Total lbs lost since last in-office visit: 3 lbs  Interim History: Rebecca Mcneil is down 3 lbs since her first visit and she did start during the holidays which can be difficult. She has been journaling (calories and protein).  Subjective:   1. Insulin resistance Rebecca Mcneil is currently not on medications. Her last A1C was 5.4. Her Insulin was 14.8.  2. Other specified hypothyroidism Rebecca Mcneil is currently taking Synthroid. She note fatigue. Her thyroid levels are good.   Assessment/Plan:   1. Insulin resistance Jovonna will continue to work on weight loss, exercise, and decreasing simple carbohydrates to help decrease the risk of diabetes. Nickisha agreed to follow-up with [place] as directed to closely monitor her progress. She will increase healthy fats. Insulin resistance handout was provided today.   2. Other specified hypothyroidism Rebecca Mcneil will continue taking Synthoid. Orders and follow up as documented in patient record.  Counseling Good thyroid control is important for overall health. Supratherapeutic thyroid levels are dangerous and will not improve weight loss results. Counseling: The correct way to take levothyroxine is fasting, with water, separated by at least 30 minutes from breakfast, and separated by more than 4 hours from calcium, iron, multivitamins, acid reflux medications (PPIs).    3. Obesity with current BMI of 40.1 Rebecca Mcneil is currently  in the action stage of change. As such, her goal is to continue with weight loss efforts. She has agreed to keeping a food journal and adhering to recommended goals of 1500 calories and 90 grams of protein.   Rebecca Mcneil will adhere more closely to the plan. We reviewed labs from 09/05/2021 A1C, insulin, and glucose. She will journal with protein and calories. Eating Out handout was provided today.   Exercise goals:  Rebecca Mcneil will start walking with small weights.   Behavioral modification strategies: increasing lean protein intake, decreasing simple carbohydrates, increasing vegetables, increasing water intake, decreasing eating out, no skipping meals, meal planning and cooking strategies, keeping healthy foods in the home, and planning for success.  Rebecca Mcneil has agreed to follow-up with our clinic in 2 weeks. She was informed of the importance of frequent follow-up visits to maximize her success with intensive lifestyle modifications for her multiple health conditions.   Objective:   Blood pressure 124/85, pulse 82, temperature 98.1 F (36.7 C), height 5' 7"  (1.702 m), weight 256 lb (116.1 kg), last menstrual period 09/03/2021, SpO2 99 %. Body mass index is 40.1 kg/m.  General: Cooperative, alert, well developed, in no acute distress. HEENT: Conjunctivae and lids unremarkable. Cardiovascular: Regular rhythm.  Lungs: Normal work of breathing. Neurologic: No focal deficits.   Lab Results  Component Value Date   CREATININE 0.62 12/21/2019   BUN 13 12/21/2019   NA 143 12/21/2019   K 4.5 12/21/2019   CL 108 (H) 12/21/2019   CO2 20 12/21/2019   Lab Results  Component Value Date   ALT 15 12/21/2019   AST 14 12/21/2019  ALKPHOS 122 (H) 12/21/2019   BILITOT 0.3 12/21/2019   Lab Results  Component Value Date   HGBA1C 5.4 09/05/2021   HGBA1C 5.0 07/28/2019   Lab Results  Component Value Date   INSULIN 14.8 09/05/2021   INSULIN 19.7 12/21/2019   INSULIN 18.4 07/28/2019   Lab Results   Component Value Date   TSH 0.401 (L) 10/11/2019   Lab Results  Component Value Date   CHOL 174 07/28/2019   HDL 50 07/28/2019   LDLCALC 105 (H) 07/28/2019   TRIG 104 07/28/2019   Lab Results  Component Value Date   VD25OH 37.9 12/21/2019   VD25OH 33.0 07/28/2019   Lab Results  Component Value Date   WBC 8.1 12/21/2019   HGB 14.9 12/21/2019   HCT 45.4 12/21/2019   MCV 86 12/21/2019   PLT 417 12/21/2019   No results found for: IRON, TIBC, FERRITIN  Attestation Statements:   Reviewed by clinician on day of visit: allergies, medications, problem list, medical history, surgical history, family history, social history, and previous encounter notes.  I, Lizbeth Bark, RMA, am acting as Location manager for [employer], DO.  I have reviewed the above documentation for accuracy and completeness, and I agree with the above. Jearld Lesch, DO

## 2021-10-03 ENCOUNTER — Ambulatory Visit (INDEPENDENT_AMBULATORY_CARE_PROVIDER_SITE_OTHER): Payer: No Typology Code available for payment source | Admitting: Bariatrics

## 2021-10-15 ENCOUNTER — Ambulatory Visit (INDEPENDENT_AMBULATORY_CARE_PROVIDER_SITE_OTHER): Payer: No Typology Code available for payment source | Admitting: Bariatrics

## 2021-10-29 ENCOUNTER — Other Ambulatory Visit: Payer: Self-pay

## 2021-10-29 ENCOUNTER — Encounter (INDEPENDENT_AMBULATORY_CARE_PROVIDER_SITE_OTHER): Payer: Self-pay | Admitting: Bariatrics

## 2021-10-29 ENCOUNTER — Ambulatory Visit (INDEPENDENT_AMBULATORY_CARE_PROVIDER_SITE_OTHER): Payer: No Typology Code available for payment source | Admitting: Bariatrics

## 2021-10-29 VITALS — BP 133/83 | HR 80 | Temp 98.1°F | Ht 67.0 in | Wt 259.0 lb

## 2021-10-29 DIAGNOSIS — E8881 Metabolic syndrome: Secondary | ICD-10-CM

## 2021-10-29 DIAGNOSIS — E559 Vitamin D deficiency, unspecified: Secondary | ICD-10-CM

## 2021-10-29 DIAGNOSIS — E669 Obesity, unspecified: Secondary | ICD-10-CM | POA: Diagnosis not present

## 2021-10-29 DIAGNOSIS — Z6841 Body Mass Index (BMI) 40.0 and over, adult: Secondary | ICD-10-CM

## 2021-10-30 NOTE — Progress Notes (Signed)
Chief Complaint:   OBESITY Rebecca Mcneil is here to discuss her progress with her obesity treatment plan along with follow-up of her obesity related diagnoses. Rebecca Mcneil is on keeping a food journal and adhering to recommended goals of 1500 calories and 90 grams of protein and states she is following her eating plan approximately 25% of the time. Rebecca Mcneil states she is doing 0 minutes 0 times per week.  Today's visit was #: 3 Starting weight: 259 lbs Starting date: 09/05/2021 Today's weight: 259 lbs Today's date: 10/29/2021 Total lbs lost to date: 0 Total lbs lost since last in-office visit: 0  Interim History: Rebecca Mcneil is up 3 lbs since her last visit. She has zero energy and sit. She does well with dinner and breakfast. She has been out of town.   Subjective:   1. Insulin resistance Rebecca Mcneil is not on medications currently.  2. Vitamin D deficiency Rebecca Mcneil is currently taking Vitamin D.  Assessment/Plan:   1. Insulin resistance Rebecca Mcneil will continue to work on weight loss, exercise, and decreasing simple carbohydrates to help decrease the risk of diabetes. She will adhere closely to the plan. Rebecca Mcneil agreed to follow-up with Rebecca Mcneil as directed to closely monitor her progress.  2. Vitamin D deficiency Low Vitamin D level contributes to fatigue and are associated with obesity, breast, and colon cancer. Rebecca Mcneil agrees to continue to take prescription Vitamin D 50,000 IU every week and she will follow-up for routine testing of Vitamin D, at least 2-3 times per year to avoid over-replacement.  3. Obesity with current BMI of 40.7 Rebecca Mcneil is currently in the action stage of change. As such, her goal is to continue with weight loss efforts. She has agreed to keeping a food journal and adhering to recommended goals of 1500 calories and 90 grams of protein.   Rebecca Mcneil will continue meal planning and she will continue intentional eating. Dinner - 500 calories and about 30 grams of protein. She will keep her  water intake high.  Exercise goals:  Rebecca Mcneil will walk to the park.  Behavioral modification strategies: increasing lean protein intake, decreasing simple carbohydrates, increasing vegetables, increasing water intake, decreasing eating out, no skipping meals, meal planning and cooking strategies, keeping healthy foods in the home, and planning for success.  Rebecca Mcneil has agreed to follow-up with our clinic in 2 weeks (fasting). She was informed of the importance of frequent follow-up visits to maximize her success with intensive lifestyle modifications for her multiple health conditions.   Objective:   Blood pressure 133/83, pulse 80, temperature 98.1 F (36.7 C), height 5' 7"  (1.702 m), weight 259 lb (117.5 kg), SpO2 99 %. Body mass index is 40.57 kg/m.  General: Cooperative, alert, well developed, in no acute distress. HEENT: Conjunctivae and lids unremarkable. Cardiovascular: Regular rhythm.  Lungs: Normal work of breathing. Neurologic: No focal deficits.   Lab Results  Component Value Date   CREATININE 0.62 12/21/2019   BUN 13 12/21/2019   NA 143 12/21/2019   K 4.5 12/21/2019   CL 108 (H) 12/21/2019   CO2 20 12/21/2019   Lab Results  Component Value Date   ALT 15 12/21/2019   AST 14 12/21/2019   ALKPHOS 122 (H) 12/21/2019   BILITOT 0.3 12/21/2019   Lab Results  Component Value Date   HGBA1C 5.4 09/05/2021   HGBA1C 5.0 07/28/2019   Lab Results  Component Value Date   INSULIN 14.8 09/05/2021   INSULIN 19.7 12/21/2019   INSULIN 18.4 07/28/2019   Lab Results  Component Value Date   TSH 0.401 (L) 10/11/2019   Lab Results  Component Value Date   CHOL 174 07/28/2019   HDL 50 07/28/2019   LDLCALC 105 (H) 07/28/2019   TRIG 104 07/28/2019   Lab Results  Component Value Date   VD25OH 37.9 12/21/2019   VD25OH 33.0 07/28/2019   Lab Results  Component Value Date   WBC 8.1 12/21/2019   HGB 14.9 12/21/2019   HCT 45.4 12/21/2019   MCV 86 12/21/2019   PLT 417  12/21/2019   No results found for: IRON, TIBC, FERRITIN  Attestation Statements:   Reviewed by clinician on day of visit: allergies, medications, problem list, medical history, surgical history, family history, social history, and previous encounter notes.  I, Lizbeth Bark, RMA, am acting as Location manager for [employer], DO.  I have reviewed the above documentation for accuracy and completeness, and I agree with the above. Jearld Lesch, DO

## 2021-10-31 ENCOUNTER — Encounter (INDEPENDENT_AMBULATORY_CARE_PROVIDER_SITE_OTHER): Payer: Self-pay | Admitting: Bariatrics

## 2021-11-12 ENCOUNTER — Encounter (INDEPENDENT_AMBULATORY_CARE_PROVIDER_SITE_OTHER): Payer: Self-pay | Admitting: Physician Assistant

## 2021-11-13 ENCOUNTER — Ambulatory Visit (INDEPENDENT_AMBULATORY_CARE_PROVIDER_SITE_OTHER): Payer: No Typology Code available for payment source | Admitting: Physician Assistant

## 2022-04-24 ENCOUNTER — Encounter (INDEPENDENT_AMBULATORY_CARE_PROVIDER_SITE_OTHER): Payer: Self-pay
